# Patient Record
Sex: Male | Born: 1935 | Race: White | Hispanic: No | Marital: Married | Smoking: Never smoker
Health system: Southern US, Community
[De-identification: ages and names within clinical notes are randomized; demographics above are authoritative.]

## PROBLEM LIST (undated history)

## (undated) DIAGNOSIS — Z973 Presence of spectacles and contact lenses: Secondary | ICD-10-CM

## (undated) DIAGNOSIS — M1710 Unilateral primary osteoarthritis, unspecified knee: Secondary | ICD-10-CM

## (undated) DIAGNOSIS — R32 Unspecified urinary incontinence: Secondary | ICD-10-CM

## (undated) DIAGNOSIS — Z974 Presence of external hearing-aid: Secondary | ICD-10-CM

## (undated) DIAGNOSIS — E785 Hyperlipidemia, unspecified: Secondary | ICD-10-CM

## (undated) DIAGNOSIS — N400 Enlarged prostate without lower urinary tract symptoms: Secondary | ICD-10-CM

## (undated) DIAGNOSIS — K219 Gastro-esophageal reflux disease without esophagitis: Secondary | ICD-10-CM

## (undated) DIAGNOSIS — K5909 Other constipation: Secondary | ICD-10-CM

## (undated) DIAGNOSIS — R339 Retention of urine, unspecified: Secondary | ICD-10-CM

## (undated) DIAGNOSIS — M179 Osteoarthritis of knee, unspecified: Secondary | ICD-10-CM

## (undated) HISTORY — PX: OTHER SURGICAL HISTORY: SHX169

## (undated) HISTORY — PX: TONSILLECTOMY: SUR1361

## (undated) HISTORY — DX: Hyperlipidemia, unspecified: E78.5

## (undated) HISTORY — PX: KNEE SURGERY: SHX244

---

## 1997-12-21 ENCOUNTER — Ambulatory Visit (HOSPITAL_COMMUNITY): Admission: RE | Admit: 1997-12-21 | Discharge: 1997-12-21 | Payer: Self-pay | Admitting: Urology

## 2006-09-16 ENCOUNTER — Ambulatory Visit (HOSPITAL_COMMUNITY): Admission: RE | Admit: 2006-09-16 | Discharge: 2006-09-16 | Payer: Self-pay | Admitting: Family Medicine

## 2008-06-14 ENCOUNTER — Ambulatory Visit (HOSPITAL_COMMUNITY): Admission: RE | Admit: 2008-06-14 | Discharge: 2008-06-14 | Payer: Self-pay | Admitting: Ophthalmology

## 2008-06-28 ENCOUNTER — Ambulatory Visit (HOSPITAL_COMMUNITY): Admission: RE | Admit: 2008-06-28 | Discharge: 2008-06-28 | Payer: Self-pay | Admitting: Ophthalmology

## 2009-08-06 ENCOUNTER — Ambulatory Visit (HOSPITAL_COMMUNITY): Admission: RE | Admit: 2009-08-06 | Discharge: 2009-08-06 | Payer: Self-pay | Admitting: Family Medicine

## 2010-02-15 ENCOUNTER — Ambulatory Visit (HOSPITAL_COMMUNITY): Admission: RE | Admit: 2010-02-15 | Discharge: 2010-02-15 | Payer: Self-pay | Admitting: Family Medicine

## 2010-06-02 ENCOUNTER — Emergency Department (HOSPITAL_COMMUNITY): Admission: EM | Admit: 2010-06-02 | Discharge: 2010-06-03 | Payer: Self-pay | Admitting: Emergency Medicine

## 2010-10-07 LAB — URINALYSIS, ROUTINE W REFLEX MICROSCOPIC
Glucose, UA: NEGATIVE mg/dL
Ketones, ur: NEGATIVE mg/dL
Nitrite: POSITIVE — AB
Protein, ur: 30 mg/dL — AB
Specific Gravity, Urine: 1.023 (ref 1.005–1.030)

## 2010-10-07 LAB — URINE CULTURE: Colony Count: >=100000

## 2010-10-07 LAB — URINE MICROSCOPIC-ADD ON

## 2010-10-11 LAB — URINALYSIS, MICROSCOPIC ONLY
Glucose, UA: NEGATIVE mg/dL
Nitrite: NEGATIVE
Protein, ur: NEGATIVE mg/dL
Specific Gravity, Urine: 1.031 — ABNORMAL HIGH (ref 1.005–1.030)
Urobilinogen, UA: 1 mg/dL (ref 0.0–1.0)

## 2011-04-28 LAB — BASIC METABOLIC PANEL
Calcium: 8.4
Chloride: 106
Creatinine, Ser: 0.76
GFR calc Af Amer: >60
Potassium: 3.4 — ABNORMAL LOW

## 2011-04-28 LAB — CBC
HCT: 33.6 — ABNORMAL LOW
Hemoglobin: 11.6 — ABNORMAL LOW
RBC: 3.58 — ABNORMAL LOW
RDW: 13.3

## 2012-03-22 ENCOUNTER — Other Ambulatory Visit: Payer: Self-pay | Admitting: Family Medicine

## 2012-03-22 DIAGNOSIS — H539 Unspecified visual disturbance: Secondary | ICD-10-CM

## 2012-03-22 DIAGNOSIS — Z139 Encounter for screening, unspecified: Secondary | ICD-10-CM

## 2012-03-25 ENCOUNTER — Ambulatory Visit
Admission: RE | Admit: 2012-03-25 | Discharge: 2012-03-25 | Disposition: A | Discharge status: Home / Self Care | Payer: Medicare Other | Source: Ambulatory Visit | Attending: Family Medicine | Admitting: Family Medicine

## 2012-03-25 DIAGNOSIS — Z139 Encounter for screening, unspecified: Secondary | ICD-10-CM

## 2012-03-25 DIAGNOSIS — H539 Unspecified visual disturbance: Secondary | ICD-10-CM

## 2012-04-04 ENCOUNTER — Other Ambulatory Visit: Payer: Self-pay | Admitting: Family Medicine

## 2012-04-04 DIAGNOSIS — H532 Diplopia: Secondary | ICD-10-CM

## 2012-04-06 ENCOUNTER — Other Ambulatory Visit: Payer: Medicare Other

## 2012-04-11 ENCOUNTER — Ambulatory Visit
Admission: RE | Admit: 2012-04-11 | Discharge: 2012-04-11 | Disposition: A | Discharge status: Home / Self Care | Payer: Medicare Other | Source: Ambulatory Visit | Attending: Family Medicine | Admitting: Family Medicine

## 2012-04-11 DIAGNOSIS — H532 Diplopia: Secondary | ICD-10-CM

## 2012-10-31 ENCOUNTER — Ambulatory Visit (INDEPENDENT_AMBULATORY_CARE_PROVIDER_SITE_OTHER): Payer: Medicare Other

## 2012-10-31 DIAGNOSIS — E538 Deficiency of other specified B group vitamins: Secondary | ICD-10-CM

## 2012-10-31 MED ORDER — CYANOCOBALAMIN 1000 MCG/ML IJ SOLN
1000.0000 ug | INTRAMUSCULAR | Status: DC
  Administered 2012-10-31 – 2014-08-23 (×17): 1000 ug via INTRAMUSCULAR

## 2012-11-17 ENCOUNTER — Telehealth: Payer: Self-pay | Admitting: Family Medicine

## 2012-11-17 NOTE — Telephone Encounter (Signed)
Samples of Bystolic,  Zetia, given for his wife, Kathie Rhodes.

## 2012-12-01 ENCOUNTER — Ambulatory Visit: Payer: Medicare Other

## 2012-12-01 ENCOUNTER — Ambulatory Visit (INDEPENDENT_AMBULATORY_CARE_PROVIDER_SITE_OTHER): Payer: Medicare Other | Admitting: *Deleted

## 2012-12-01 DIAGNOSIS — E538 Deficiency of other specified B group vitamins: Secondary | ICD-10-CM

## 2012-12-01 NOTE — Progress Notes (Signed)
Patient tolerated well.

## 2012-12-01 NOTE — Patient Instructions (Signed)
Vitamin B12 Injections Every person needs vitamin B12. A deficiency develops when the body does not get enough of it. One way to overcome this is by getting B12 shots (injections). A B12 shot puts the vitamin directly into muscle tissue. This avoids any problems your body might have in absorbing it from food or a pill. In some people, the body has trouble using the vitamin correctly. This can cause a B12 deficiency. Not consuming enough of the vitamin can also cause a deficiency. Getting enough vitamin B12 can be hard for elderly people. Sometimes, they do not eat a well-balanced diet. The elderly are also more likely than younger people to have medical conditions or take medications that can lead to a deficiency. WHAT DOES VITAMIN B12 DO? Vitamin B12 does many things to help the body work right:  It helps the body make healthy red blood cells.  It helps maintain nerve cells.  It is involved in the body's process of converting food into energy (metabolism).  It is needed to make the genetic material in all cells (DNA). VITAMIN B12 FOOD SOURCES Most people get plenty of vitamin B12 through the foods they eat. It is present in:  Meat, fish, poultry, and eggs.  Milk and milk products.  It also is added when certain foods are made, including some breads, cereals and yogurts. The food is then called "fortified". CAUSES The most common causes of vitamin B12 deficiency are:  Pernicious anemia. The condition develops when the body cannot make enough healthy red blood cells. This stems from a lack of a protein made in the stomach (intrinsic factor). People without this protein cannot absorb enough vitamin B12 from food.  Malabsorption. This is when the body cannot absorb the vitamin. It can be caused by:  Pernicious anemia.  Surgery to remove part or all of the stomach can lead to malabsorption. Removal of part or all of the small intestine can also cause malabsorption.  Vegetarian diet.  People who are strict about not eating foods from animals could have trouble taking in enough vitamin B12 from diet alone.  Medications. Some medicines have been linked to B12 deficiency, such as Metformin (a drug prescribed for type 2 diabetes). Long-term use of stomach acid suppressants also can keep the vitamin from being absorbed.  Intestinal problems such as inflammatory bowel disease. If there are problems in the digestive tract, vitamin B12 may not be absorbed in good enough amounts. SYMPTOMS People who do not get enough B12 can develop problems. These can include:  Anemia. This is when the body has too few red blood cells. Red blood cells carry oxygen to the rest of the body. Without a healthy supply of red blood cells, people can feel:  Tired (fatigued).  Weak.  Severe anemia can cause:  Shortness of breath.  Dizziness.  Rapid heart rate.  Paleness.  Other Vitamin B12 deficiency symptoms include:  Diarrhea.  Numbness or tingling in the hands or feet.  Loss of appetite.  Confusion.  Sores on the tongue or in the mouth. LET YOUR CAREGIVER KNOW ABOUT:  Any allergies. It is very important to know if you are allergic or sensitive to cobalt. Vitamin B12 contains cobalt.  Any history of kidney disease.  All medications you are taking. Include prescription and over-the-counter medicines, herbs and creams.  Whether you are pregnant or breast-feeding.  If you have Leber's disease, a hereditary eye condition, vitamin B12 could make it worse. RISKS AND COMPLICATIONS Reactions to an injection are   usually temporary. They might include:  Pain at the injection site.  Redness, swelling or tenderness at the site.  Headache, dizziness or weakness.  Nausea, upset stomach or diarrhea.  Numbness or tingling.  Fever.  Joint pain.  Itching or rash. If a reaction does not go away in a short while, talk with your healthcare provider. A change in the way the shots are  given, or where they are given, might need to be made. BEFORE AN INJECTION To decide whether B12 injections are right for you, your healthcare provider will probably:  Ask about your medical history.  Ask questions about your diet.  Ask about symptoms such as:  Have you felt weak?  Do you feel unusually tired?  Do you get dizzy?  Order blood tests. These may include a test to:  Check the level of red cells in your blood.  Measure B12 levels.  Check for the presence of intrinsic factor. VITAMIN B12 INJECTIONS How often you will need a vitamin B12 injection will depend on how severe your deficiency is. This also will affect how long you will need to get them. People with pernicious anemia usually get injections for their entire life. Others might get them for a shorter period. For many people, injections are given daily or weekly for several weeks. Then, once B12 levels are normal, injections are given just once a month. If the cause of the deficiency can be fixed, the injections can be stopped. Talk with your healthcare provider about what you should expect. For an injection:  The injection site will be cleaned with an alcohol swab.  Your healthcare provider will insert a needle directly into a muscle. Most any muscle can be used. Most often, an arm muscle is used. A buttocks muscle can also be used. Many people say shots in that area are less painful.  A small adhesive bandage may be put over the injection site. It usually can be taken off in an hour or less. Injections can be given by your healthcare provider. In some cases, family members give them. Sometimes, people give them to themselves. Talk with your healthcare provider about what would be best for you. If someone other than your healthcare provider will be giving the shots, the person will need to be trained to give them correctly. HOME CARE INSTRUCTIONS   You can remove the adhesive bandage within an hour of getting a  shot.  You should be able to go about your normal activities right away.  Avoid drinking large amounts of alcohol while taking vitamin B12 shots. Alcohol can interfere with the body's use of the vitamin. SEEK MEDICAL CARE IF:   Pain, redness, swelling or tenderness at the injection site does not get better or gets worse.  Headache, dizziness or weakness does not go away.  You develop a fever of more than 100.5 F (38.1 C). SEEK IMMEDIATE MEDICAL CARE IF:   You have chest pain.  You develop shortness of breath.  You have muscle weakness that gets worse.  You develop numbness, weakness or tingling on one side or one area of the body.  You have symptoms of an allergic reaction, such as:  Hives.  Difficulty breathing.  Swelling of the lips, face, tongue or throat.  You develop a fever of more than 102.0 F (38.9 C). MAKE SURE YOU:   Understand these instructions.  Will watch your condition.  Will get help right away if you are not doing well or get worse. Document   Released: 10/09/2008 Document Revised: 10/05/2011 Document Reviewed: 10/09/2008 [employer] Patient Information 2013 [employer].  

## 2012-12-01 NOTE — Progress Notes (Deleted)
  Subjective:    Patient ID: Luke Carter, male    DOB: 1921-05-30, 79 y.o.   MRN: [id number]  HPI    Review of Systems     Objective:   Physical Exam        Assessment & Plan:

## 2012-12-20 ENCOUNTER — Encounter: Payer: Self-pay | Admitting: *Deleted

## 2012-12-26 ENCOUNTER — Telehealth: Payer: Self-pay | Admitting: Family Medicine

## 2012-12-26 NOTE — Telephone Encounter (Signed)
No samples available Pt notified 

## 2013-01-04 ENCOUNTER — Ambulatory Visit (INDEPENDENT_AMBULATORY_CARE_PROVIDER_SITE_OTHER): Payer: Medicare Other

## 2013-01-04 DIAGNOSIS — E538 Deficiency of other specified B group vitamins: Secondary | ICD-10-CM

## 2013-01-11 ENCOUNTER — Ambulatory Visit: Payer: Self-pay | Admitting: Family Medicine

## 2013-01-17 ENCOUNTER — Telehealth: Payer: Self-pay | Admitting: Family Medicine

## 2013-01-17 NOTE — Telephone Encounter (Signed)
PLEASE RE SCHEDULE

## 2013-01-24 ENCOUNTER — Ambulatory Visit: Payer: Self-pay | Admitting: Family Medicine

## 2013-02-01 ENCOUNTER — Telehealth: Payer: Self-pay | Admitting: Family Medicine

## 2013-02-01 NOTE — Telephone Encounter (Signed)
No samples patient aware. 

## 2013-02-06 ENCOUNTER — Ambulatory Visit: Payer: Medicare Other

## 2013-02-08 ENCOUNTER — Ambulatory Visit: Payer: Medicare Other | Admitting: Family Medicine

## 2013-02-10 ENCOUNTER — Ambulatory Visit (INDEPENDENT_AMBULATORY_CARE_PROVIDER_SITE_OTHER): Payer: Medicare Other

## 2013-02-10 DIAGNOSIS — E538 Deficiency of other specified B group vitamins: Secondary | ICD-10-CM

## 2013-02-14 ENCOUNTER — Other Ambulatory Visit: Payer: Self-pay | Admitting: Family Medicine

## 2013-02-15 ENCOUNTER — Other Ambulatory Visit: Payer: Medicare Other

## 2013-02-15 ENCOUNTER — Ambulatory Visit (INDEPENDENT_AMBULATORY_CARE_PROVIDER_SITE_OTHER): Payer: Medicare Other

## 2013-02-15 ENCOUNTER — Encounter: Payer: Self-pay | Admitting: Family Medicine

## 2013-02-15 ENCOUNTER — Ambulatory Visit (INDEPENDENT_AMBULATORY_CARE_PROVIDER_SITE_OTHER): Payer: Medicare Other | Admitting: Family Medicine

## 2013-02-15 ENCOUNTER — Other Ambulatory Visit: Payer: Self-pay | Admitting: Family Medicine

## 2013-02-15 VITALS — BP 122/61 | HR 64 | Temp 97.1°F | Ht 67.0 in | Wt 165.2 lb

## 2013-02-15 DIAGNOSIS — K219 Gastro-esophageal reflux disease without esophagitis: Secondary | ICD-10-CM | POA: Insufficient documentation

## 2013-02-15 DIAGNOSIS — E039 Hypothyroidism, unspecified: Secondary | ICD-10-CM | POA: Insufficient documentation

## 2013-02-15 DIAGNOSIS — E291 Testicular hypofunction: Secondary | ICD-10-CM

## 2013-02-15 DIAGNOSIS — Z79899 Other long term (current) drug therapy: Secondary | ICD-10-CM

## 2013-02-15 DIAGNOSIS — R5383 Other fatigue: Secondary | ICD-10-CM

## 2013-02-15 DIAGNOSIS — N400 Enlarged prostate without lower urinary tract symptoms: Secondary | ICD-10-CM

## 2013-02-15 DIAGNOSIS — R0602 Shortness of breath: Secondary | ICD-10-CM

## 2013-02-15 DIAGNOSIS — E538 Deficiency of other specified B group vitamins: Secondary | ICD-10-CM

## 2013-02-15 DIAGNOSIS — E785 Hyperlipidemia, unspecified: Secondary | ICD-10-CM

## 2013-02-15 DIAGNOSIS — I10 Essential (primary) hypertension: Secondary | ICD-10-CM

## 2013-02-15 LAB — POCT CBC
Granulocyte percent: 50.8 %G (ref 37–80)
HCT, POC: 35.2 % — AB (ref 43.5–53.7)
MCH, POC: 30.6 pg (ref 27–31.2)
POC Granulocyte: 2.1 (ref 2–6.9)
POC LYMPH PERCENT: 41 %L (ref 10–50)
Platelet Count, POC: 173 10*3/uL (ref 142–424)
WBC: 4.2 10*3/uL — AB (ref 4.6–10.2)

## 2013-02-15 NOTE — Patient Instructions (Addendum)
Fall precautions discussed Continue current meds and therapeutic lifestyle changes 

## 2013-02-15 NOTE — Progress Notes (Signed)
  Subjective:    Patient ID: Luke Carter, male    DOB: 31-Aug-1920, 79 y.o.   MRN: [id number]  HPI Patient comes in today for routine followup of chronic medical problems. He is followed by the urologist also. His biggest complaint is his fatigue with some shortness of breath and arthralgias. He has a history of hyperlipidemia, hypothyroidism, GERD, and B12 deficiency.   Review of Systems  Constitutional: Positive for fatigue (worsening).  HENT: Negative for congestion, sneezing and postnasal drip.   Eyes: Negative.   Respiratory: Positive for shortness of breath (with exertion). Negative for cough and chest tightness.   Cardiovascular: Negative for chest pain and leg swelling.  Gastrointestinal: Negative.   Endocrine: Negative.   Genitourinary: Positive for urgency and frequency. Negative for dysuria.  Musculoskeletal: Positive for arthralgias (R hip, bilateral knees). Negative for back pain.  Skin: Positive for rash (bilateral arms).  Allergic/Immunologic: Negative for environmental allergies.  Neurological: Positive for tremors (intermitent).  Psychiatric/Behavioral: Negative for sleep disturbance. The patient is not nervous/anxious.        Objective:   Physical Exam BP 122/61  Pulse 64  Temp(Src) 97.1 F (36.2 C) (Oral)  Ht 5\' 7"  (1.702 m)  Wt 165 lb 3.2 oz (74.934 kg)  BMI 25.87 kg/m2  The patient appeared well nourished and normally developed for his age, alert and oriented to time and place. Speech, behavior and judgement appear normal. Vital signs as documented.  Head exam is unremarkable. No scleral icterus or pallor noted. There was some nasal congestion bilaterally. Mouth throat and ears were normal. He wears bilateral hearing aids.  Neck is without jugular venous distension, thyromegally, or carotid bruits. Carotid upstrokes are brisk bilaterally. No cervical adenopathy. Lungs are clear anteriorly and posteriorly to auscultation. Normal respiratory effort. Cardiac  exam reveals regular rate and rhythm at 72 per minute. First and second heart sounds normal.  No murmurs, rubs or gallops.  Abdominal exam reveals normal bowl sounds, no masses, no organomegaly and no aortic enlargement. No inguinal adenopathy. There was no abdominal tenderness. Extremities are nonedematous and both femoral and pedal pulses are normal. There was some lower extremity varices. Skin without pallor or jaundice.  Warm and dry, without rash. Neurologic exam reveals normal deep tendon reflexes and normal sensation.    [facility] reading (PRIMARY) by  Dr. 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                                   Assessment & Plan:  1. Vitamin B12 deficiency - Vitamin B12  2. Hyperlipemia  3. GERD (gastroesophageal reflux disease)  4. Hypothyroid  5. BPH (benign prostatic hypertrophy) - Testosterone, Free, Direct  6. SOB (shortness of breath) - DG Chest 2 View  7. 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

## 2013-02-16 ENCOUNTER — Telehealth: Payer: Self-pay | Admitting: *Deleted

## 2013-02-16 LAB — TESTOSTERONE, FREE, DIRECT: Free Testosterone, Direct: 0.9 pg/mL — ABNORMAL LOW (ref 3.8–34.2)

## 2013-02-16 LAB — THYROID PANEL WITH TSH: T3 Uptake: 37.7 % — ABNORMAL HIGH (ref 22.5–37.0)

## 2013-02-16 LAB — HEPATIC FUNCTION PANEL
ALT: 14 U/L (ref 0–53)
Total Protein: 6.2 g/dL (ref 6.0–8.3)

## 2013-02-16 LAB — BASIC METABOLIC PANEL WITH GFR
CO2: 28 mEq/L (ref 19–32)
Calcium: 9.3 mg/dL (ref 8.4–10.5)
Chloride: 107 mEq/L (ref 96–112)
Creat: 0.86 mg/dL (ref 0.50–1.35)
Glucose, Bld: 99 mg/dL (ref 70–99)
Potassium: 4.7 mEq/L (ref 3.5–5.3)

## 2013-02-16 LAB — VITAMIN B12: Vitamin B-12: 606 pg/mL (ref 211–911)

## 2013-02-16 LAB — NMR LIPOPROFILE WITH LIPIDS
Cholesterol, Total: 132 mg/dL (ref <200)
LDL (calc): 72 mg/dL (ref <100)
LDL Particle Number: 1330 nmol/L — ABNORMAL HIGH (ref <1000)
LP-IR Score: 49 — ABNORMAL HIGH (ref <=45)
Large VLDL-P: <0.8 nmol/L (ref <=2.7)
VLDL Size: 47.7 nm — ABNORMAL HIGH (ref <=46.6)

## 2013-02-16 NOTE — Telephone Encounter (Signed)
Pt notified of results

## 2013-02-16 NOTE — Telephone Encounter (Signed)
Message copied by Bearl Mulberry on Thu Feb 16, 2013  7:08 PM ------      Message from: Ernestina Penna      Created: Wed Feb 15, 2013  1:26 PM       The CBC has a slightly decreased but stable hemoglobin and a white blood cell count is not elevated. The platelet count is adequate . ------

## 2013-02-20 ENCOUNTER — Telehealth: Payer: Self-pay | Admitting: *Deleted

## 2013-02-20 DIAGNOSIS — M25551 Pain in right hip: Secondary | ICD-10-CM

## 2013-02-20 NOTE — Telephone Encounter (Signed)
Pt needs to see Dr Berton Lan in [place] for hip pain has appt in Sept but needs to be seen sooner

## 2013-02-23 ENCOUNTER — Other Ambulatory Visit: Payer: Self-pay | Admitting: *Deleted

## 2013-02-23 DIAGNOSIS — G8929 Other chronic pain: Secondary | ICD-10-CM

## 2013-02-27 ENCOUNTER — Ambulatory Visit (HOSPITAL_COMMUNITY)
Admission: RE | Admit: 2013-02-27 | Discharge: 2013-02-27 | Disposition: A | Discharge status: Home / Self Care | Payer: Medicare Other | Source: Ambulatory Visit | Attending: Family Medicine | Admitting: Family Medicine

## 2013-02-27 DIAGNOSIS — M76899 Other specified enthesopathies of unspecified lower limb, excluding foot: Secondary | ICD-10-CM | POA: Insufficient documentation

## 2013-02-27 DIAGNOSIS — M533 Sacrococcygeal disorders, not elsewhere classified: Secondary | ICD-10-CM | POA: Insufficient documentation

## 2013-02-27 DIAGNOSIS — M25559 Pain in unspecified hip: Secondary | ICD-10-CM | POA: Insufficient documentation

## 2013-02-27 DIAGNOSIS — G8929 Other chronic pain: Secondary | ICD-10-CM

## 2013-02-27 DIAGNOSIS — M25551 Pain in right hip: Secondary | ICD-10-CM

## 2013-02-28 NOTE — Addendum Note (Signed)
Addended by: Orma Render F on: 02/28/2013 04:12 PM   Modules accepted: Orders

## 2013-02-28 NOTE — Addendum Note (Signed)
Addended by: Orma Render F on: 02/28/2013 04:06 PM   Modules accepted: Orders

## 2013-03-15 ENCOUNTER — Ambulatory Visit (INDEPENDENT_AMBULATORY_CARE_PROVIDER_SITE_OTHER): Payer: Medicare Other

## 2013-03-15 DIAGNOSIS — E538 Deficiency of other specified B group vitamins: Secondary | ICD-10-CM

## 2013-03-15 NOTE — Progress Notes (Signed)
Tolerated B12 injection without difficulty.

## 2013-03-15 NOTE — Patient Instructions (Addendum)
Vitamin B12 Injections Every person needs vitamin B12. A deficiency develops when the body does not get enough of it. One way to overcome this is by getting B12 shots (injections). A B12 shot puts the vitamin directly into muscle tissue. This avoids any problems your body might have in absorbing it from food or a pill. In some people, the body has trouble using the vitamin correctly. This can cause a B12 deficiency. Not consuming enough of the vitamin can also cause a deficiency. Getting enough vitamin B12 can be hard for elderly people. Sometimes, they do not eat a well-balanced diet. The elderly are also more likely than younger people to have medical conditions or take medications that can lead to a deficiency. WHAT DOES VITAMIN B12 DO? Vitamin B12 does many things to help the body work right:  It helps the body make healthy red blood cells.  It helps maintain nerve cells.  It is involved in the body's process of converting food into energy (metabolism).  It is needed to make the genetic material in all cells (DNA). VITAMIN B12 FOOD SOURCES Most people get plenty of vitamin B12 through the foods they eat. It is present in:  Meat, fish, poultry, and eggs.  Milk and milk products.  It also is added when certain foods are made, including some breads, cereals and yogurts. The food is then called "fortified". CAUSES The most common causes of vitamin B12 deficiency are:  Pernicious anemia. The condition develops when the body cannot make enough healthy red blood cells. This stems from a lack of a protein made in the stomach (intrinsic factor). People without this protein cannot absorb enough vitamin B12 from food.  Malabsorption. This is when the body cannot absorb the vitamin. It can be caused by:  Pernicious anemia.  Surgery to remove part or all of the stomach can lead to malabsorption. Removal of part or all of the small intestine can also cause malabsorption.  Vegetarian diet.  People who are strict about not eating foods from animals could have trouble taking in enough vitamin B12 from diet alone.  Medications. Some medicines have been linked to B12 deficiency, such as Metformin (a drug prescribed for type 2 diabetes). Long-term use of stomach acid suppressants also can keep the vitamin from being absorbed.  Intestinal problems such as inflammatory bowel disease. If there are problems in the digestive tract, vitamin B12 may not be absorbed in good enough amounts. SYMPTOMS People who do not get enough B12 can develop problems. These can include:  Anemia. This is when the body has too few red blood cells. Red blood cells carry oxygen to the rest of the body. Without a healthy supply of red blood cells, people can feel:  Tired (fatigued).  Weak.  Severe anemia can cause:  Shortness of breath.  Dizziness.  Rapid heart rate.  Paleness.  Other Vitamin B12 deficiency symptoms include:  Diarrhea.  Numbness or tingling in the hands or feet.  Loss of appetite.  Confusion.  Sores on the tongue or in the mouth. LET YOUR CAREGIVER KNOW ABOUT:  Any allergies. It is very important to know if you are allergic or sensitive to cobalt. Vitamin B12 contains cobalt.  Any history of kidney disease.  All medications you are taking. Include prescription and over-the-counter medicines, herbs and creams.  Whether you are pregnant or breast-feeding.  If you have Leber's disease, a hereditary eye condition, vitamin B12 could make it worse. RISKS AND COMPLICATIONS Reactions to an injection are   usually temporary. They might include:  Pain at the injection site.  Redness, swelling or tenderness at the site.  Headache, dizziness or weakness.  Nausea, upset stomach or diarrhea.  Numbness or tingling.  Fever.  Joint pain.  Itching or rash. If a reaction does not go away in a short while, talk with your healthcare provider. A change in the way the shots are  given, or where they are given, might need to be made. BEFORE AN INJECTION To decide whether B12 injections are right for you, your healthcare provider will probably:  Ask about your medical history.  Ask questions about your diet.  Ask about symptoms such as:  Have you felt weak?  Do you feel unusually tired?  Do you get dizzy?  Order blood tests. These may include a test to:  Check the level of red cells in your blood.  Measure B12 levels.  Check for the presence of intrinsic factor. VITAMIN B12 INJECTIONS How often you will need a vitamin B12 injection will depend on how severe your deficiency is. This also will affect how long you will need to get them. People with pernicious anemia usually get injections for their entire life. Others might get them for a shorter period. For many people, injections are given daily or weekly for several weeks. Then, once B12 levels are normal, injections are given just once a month. If the cause of the deficiency can be fixed, the injections can be stopped. Talk with your healthcare provider about what you should expect. For an injection:  The injection site will be cleaned with an alcohol swab.  Your healthcare provider will insert a needle directly into a muscle. Most any muscle can be used. Most often, an arm muscle is used. A buttocks muscle can also be used. Many people say shots in that area are less painful.  A small adhesive bandage may be put over the injection site. It usually can be taken off in an hour or less. Injections can be given by your healthcare provider. In some cases, family members give them. Sometimes, people give them to themselves. Talk with your healthcare provider about what would be best for you. If someone other than your healthcare provider will be giving the shots, the person will need to be trained to give them correctly. HOME CARE INSTRUCTIONS   You can remove the adhesive bandage within an hour of getting a  shot.  You should be able to go about your normal activities right away.  Avoid drinking large amounts of alcohol while taking vitamin B12 shots. Alcohol can interfere with the body's use of the vitamin. SEEK MEDICAL CARE IF:   Pain, redness, swelling or tenderness at the injection site does not get better or gets worse.  Headache, dizziness or weakness does not go away.  You develop a fever of more than 100.5 F (38.1 C). SEEK IMMEDIATE MEDICAL CARE IF:   You have chest pain.  You develop shortness of breath.  You have muscle weakness that gets worse.  You develop numbness, weakness or tingling on one side or one area of the body.  You have symptoms of an allergic reaction, such as:  Hives.  Difficulty breathing.  Swelling of the lips, face, tongue or throat.  You develop a fever of more than 102.0 F (38.9 C). MAKE SURE YOU:   Understand these instructions.  Will watch your condition.  Will get help right away if you are not doing well or get worse. Document   Released: 10/09/2008 Document Revised: 10/05/2011 Document Reviewed: 10/09/2008 [employer] Patient Information 2014 [employer].  

## 2013-05-03 ENCOUNTER — Ambulatory Visit (INDEPENDENT_AMBULATORY_CARE_PROVIDER_SITE_OTHER): Payer: Medicare Other | Admitting: *Deleted

## 2013-05-03 ENCOUNTER — Encounter (INDEPENDENT_AMBULATORY_CARE_PROVIDER_SITE_OTHER): Payer: Self-pay

## 2013-05-03 DIAGNOSIS — Z23 Encounter for immunization: Secondary | ICD-10-CM

## 2013-05-03 DIAGNOSIS — E538 Deficiency of other specified B group vitamins: Secondary | ICD-10-CM

## 2013-06-09 ENCOUNTER — Ambulatory Visit (INDEPENDENT_AMBULATORY_CARE_PROVIDER_SITE_OTHER): Payer: Medicare Other | Admitting: *Deleted

## 2013-06-09 DIAGNOSIS — E538 Deficiency of other specified B group vitamins: Secondary | ICD-10-CM

## 2013-06-09 NOTE — Patient Instructions (Signed)
Vitamin B12 Injections Every person needs vitamin B12. A deficiency develops when the body does not get enough of it. One way to overcome this is by getting B12 shots (injections). A B12 shot puts the vitamin directly into muscle tissue. This avoids any problems your body might have in absorbing it from food or a pill. In some people, the body has trouble using the vitamin correctly. This can cause a B12 deficiency. Not consuming enough of the vitamin can also cause a deficiency. Getting enough vitamin B12 can be hard for elderly people. Sometimes, they do not eat a well-balanced diet. The elderly are also more likely than younger people to have medical conditions or take medications that can lead to a deficiency. WHAT DOES VITAMIN B12 DO? Vitamin B12 does many things to help the body work right:  It helps the body make healthy red blood cells.  It helps maintain nerve cells.  It is involved in the body's process of converting food into energy (metabolism).  It is needed to make the genetic material in all cells (DNA). VITAMIN B12 FOOD SOURCES Most people get plenty of vitamin B12 through the foods they eat. It is present in:  Meat, fish, poultry, and eggs.  Milk and milk products.  It also is added when certain foods are made, including some breads, cereals and yogurts. The food is then called "fortified". CAUSES The most common causes of vitamin B12 deficiency are:  Pernicious anemia. The condition develops when the body cannot make enough healthy red blood cells. This stems from a lack of a protein made in the stomach (intrinsic factor). People without this protein cannot absorb enough vitamin B12 from food.  Malabsorption. This is when the body cannot absorb the vitamin. It can be caused by:  Pernicious anemia.  Surgery to remove part or all of the stomach can lead to malabsorption. Removal of part or all of the small intestine can also cause malabsorption.  Vegetarian diet.  People who are strict about not eating foods from animals could have trouble taking in enough vitamin B12 from diet alone.  Medications. Some medicines have been linked to B12 deficiency, such as Metformin (a drug prescribed for type 2 diabetes). Long-term use of stomach acid suppressants also can keep the vitamin from being absorbed.  Intestinal problems such as inflammatory bowel disease. If there are problems in the digestive tract, vitamin B12 may not be absorbed in good enough amounts. SYMPTOMS People who do not get enough B12 can develop problems. These can include:  Anemia. This is when the body has too few red blood cells. Red blood cells carry oxygen to the rest of the body. Without a healthy supply of red blood cells, people can feel:  Tired (fatigued).  Weak.  Severe anemia can cause:  Shortness of breath.  Dizziness.  Rapid heart rate.  Paleness.  Other Vitamin B12 deficiency symptoms include:  Diarrhea.  Numbness or tingling in the hands or feet.  Loss of appetite.  Confusion.  Sores on the tongue or in the mouth. LET YOUR CAREGIVER KNOW ABOUT:  Any allergies. It is very important to know if you are allergic or sensitive to cobalt. Vitamin B12 contains cobalt.  Any history of kidney disease.  All medications you are taking. Include prescription and over-the-counter medicines, herbs and creams.  Whether you are pregnant or breast-feeding.  If you have Leber's disease, a hereditary eye condition, vitamin B12 could make it worse. RISKS AND COMPLICATIONS Reactions to an injection are   usually temporary. They might include:  Pain at the injection site.  Redness, swelling or tenderness at the site.  Headache, dizziness or weakness.  Nausea, upset stomach or diarrhea.  Numbness or tingling.  Fever.  Joint pain.  Itching or rash. If a reaction does not go away in a short while, talk with your healthcare provider. A change in the way the shots are  given, or where they are given, might need to be made. BEFORE AN INJECTION To decide whether B12 injections are right for you, your healthcare provider will probably:  Ask about your medical history.  Ask questions about your diet.  Ask about symptoms such as:  Have you felt weak?  Do you feel unusually tired?  Do you get dizzy?  Order blood tests. These may include a test to:  Check the level of red cells in your blood.  Measure B12 levels.  Check for the presence of intrinsic factor. VITAMIN B12 INJECTIONS How often you will need a vitamin B12 injection will depend on how severe your deficiency is. This also will affect how long you will need to get them. People with pernicious anemia usually get injections for their entire life. Others might get them for a shorter period. For many people, injections are given daily or weekly for several weeks. Then, once B12 levels are normal, injections are given just once a month. If the cause of the deficiency can be fixed, the injections can be stopped. Talk with your healthcare provider about what you should expect. For an injection:  The injection site will be cleaned with an alcohol swab.  Your healthcare provider will insert a needle directly into a muscle. Most any muscle can be used. Most often, an arm muscle is used. A buttocks muscle can also be used. Many people say shots in that area are less painful.  A small adhesive bandage may be put over the injection site. It usually can be taken off in an hour or less. Injections can be given by your healthcare provider. In some cases, family members give them. Sometimes, people give them to themselves. Talk with your healthcare provider about what would be best for you. If someone other than your healthcare provider will be giving the shots, the person will need to be trained to give them correctly. HOME CARE INSTRUCTIONS   You can remove the adhesive bandage within an hour of getting a  shot.  You should be able to go about your normal activities right away.  Avoid drinking large amounts of alcohol while taking vitamin B12 shots. Alcohol can interfere with the body's use of the vitamin. SEEK MEDICAL CARE IF:   Pain, redness, swelling or tenderness at the injection site does not get better or gets worse.  Headache, dizziness or weakness does not go away.  You develop a fever of more than 100.5 F (38.1 C). SEEK IMMEDIATE MEDICAL CARE IF:   You have chest pain.  You develop shortness of breath.  You have muscle weakness that gets worse.  You develop numbness, weakness or tingling on one side or one area of the body.  You have symptoms of an allergic reaction, such as:  Hives.  Difficulty breathing.  Swelling of the lips, face, tongue or throat.  You develop a fever of more than 102.0 F (38.9 C). MAKE SURE YOU:   Understand these instructions.  Will watch your condition.  Will get help right away if you are not doing well or get worse. Document   Released: 10/09/2008 Document Revised: 10/05/2011 Document Reviewed: 10/09/2008 [employer] Patient Information 2014 [employer].  

## 2013-06-09 NOTE — Progress Notes (Signed)
Vitamin b12 injection given and tolerated well.  

## 2013-07-10 ENCOUNTER — Ambulatory Visit (INDEPENDENT_AMBULATORY_CARE_PROVIDER_SITE_OTHER): Payer: Medicare Other | Admitting: *Deleted

## 2013-07-10 DIAGNOSIS — E538 Deficiency of other specified B group vitamins: Secondary | ICD-10-CM

## 2013-07-10 NOTE — Progress Notes (Signed)
Patient ID: Luke Carter, male   DOB: [birth year], 79 y.o.   MRN: [id number] Pt tolerated inj well

## 2013-07-13 ENCOUNTER — Ambulatory Visit (INDEPENDENT_AMBULATORY_CARE_PROVIDER_SITE_OTHER): Payer: Medicare Other | Admitting: Family Medicine

## 2013-07-13 ENCOUNTER — Encounter: Payer: Self-pay | Admitting: Family Medicine

## 2013-07-13 VITALS — BP 136/76 | HR 71 | Temp 96.9°F | Ht 67.0 in | Wt 166.0 lb

## 2013-07-13 DIAGNOSIS — N476 Balanoposthitis: Secondary | ICD-10-CM

## 2013-07-13 DIAGNOSIS — R350 Frequency of micturition: Secondary | ICD-10-CM

## 2013-07-13 DIAGNOSIS — R32 Unspecified urinary incontinence: Secondary | ICD-10-CM

## 2013-07-13 DIAGNOSIS — IMO0001 Reserved for inherently not codable concepts without codable children: Secondary | ICD-10-CM

## 2013-07-13 DIAGNOSIS — N481 Balanitis: Secondary | ICD-10-CM

## 2013-07-13 LAB — POCT CBC
Granulocyte percent: 58.7 %G (ref 37–80)
HCT, POC: 37.6 % — AB (ref 43.5–53.7)
Hemoglobin: 12.1 g/dL — AB (ref 14.1–18.1)
Lymph, poc: 1.9 (ref 0.6–3.4)
MCH, POC: 29.8 pg (ref 27–31.2)
MCV: 92 fL (ref 80–97)
Platelet Count, POC: 155 10*3/uL (ref 142–424)
RDW, POC: 13.1 %
WBC: 5.6 10*3/uL (ref 4.6–10.2)

## 2013-07-13 LAB — POCT URINALYSIS DIPSTICK
Bilirubin, UA: NEGATIVE
Glucose, UA: NEGATIVE
Ketones, UA: NEGATIVE
Leukocytes, UA: NEGATIVE
pH, UA: 6

## 2013-07-13 LAB — POCT UA - MICROSCOPIC ONLY: Yeast, UA: NEGATIVE

## 2013-07-13 MED ORDER — CEPHALEXIN 500 MG PO CAPS: 500.0000 mg | ORAL_CAPSULE | Freq: Three times a day (TID) | ORAL | Status: DC

## 2013-07-13 NOTE — Progress Notes (Signed)
Subjective:    Patient ID: Luke Carter, male    DOB: January 21, 1921, 79 y.o.   MRN: [id number]  HPI Patient here today for urinary frequency.    Patient Active Problem List   Diagnosis Date Noted  . Vitamin B12 deficiency 02/15/2013  . Hyperlipemia 02/15/2013  . GERD (gastroesophageal reflux disease) 02/15/2013  . Hypothyroid 02/15/2013  . BPH (benign prostatic hypertrophy) 02/15/2013   Outpatient Encounter Prescriptions as of 07/13/2013  Medication Sig  . aspirin EC 81 MG tablet Take 81 mg by mouth daily.  . Calcium Carbonate-Vit D-Min 1200-1000 MG-UNIT CHEW Chew 1 tablet by mouth daily.  . finasteride (PROSCAR) 5 MG tablet Take 5 mg by mouth daily.  . fish oil-omega-3 fatty acids 1000 MG capsule Take 2 g by mouth daily.  Marland Kitchen levothyroxine (SYNTHROID, LEVOTHROID) 50 MCG tablet TAKE 1.5 TABLETS MON, WED, FRI, AND 1 TABLET SUN, TUES, THUR, AND SAT  . Multiple Vitamin (MULTIVITAMIN) tablet Take 1 tablet by mouth daily.  . niacin (NIASPAN) 1000 MG CR tablet Take 1,000 mg by mouth at bedtime.  Marland Kitchen omeprazole (PRILOSEC) 20 MG capsule Take 20 mg by mouth 2 (two) times daily.  . pravastatin (PRAVACHOL) 40 MG tablet Take 40 mg by mouth daily.  Marland Kitchen terazosin (HYTRIN) 5 MG capsule Take 5 mg by mouth at bedtime.  . TOVIAZ 8 MG TB24     Review of Systems  Constitutional: Negative.   HENT: Negative.   Eyes: Negative.   Respiratory: Negative.   Cardiovascular: Negative.   Gastrointestinal: Negative.   Endocrine: Negative.   Genitourinary: Positive for frequency.  Musculoskeletal: Negative.   Skin: Negative.   Allergic/Immunologic: Negative.   Neurological: Negative.   Hematological: Negative.   Psychiatric/Behavioral: Negative.    patient has swelling and edema of the foreskin and glans penis     Objective:   Physical Exam  Nursing note and vitals reviewed. Constitutional: He is oriented to person, place, and time. He appears well-developed and well-nourished. No distress.  HENT:    Head: Normocephalic and atraumatic.  Eyes: Conjunctivae and EOM are normal. Pupils are equal, round, and reactive to light. Right eye exhibits no discharge. Left eye exhibits no discharge. No scleral icterus.  Neck: Normal range of motion.  Abdominal: Soft. Bowel sounds are normal. He exhibits no distension and no mass. There is no tenderness. There is no rebound and no guarding.  Patient does have a large right inguinal hernia which he indicates has not changed any of her many years.  Genitourinary:  Patient has swelling and edema of the glans penis and the foreskin. The urethral opening appears normal  Musculoskeletal: Normal range of motion.  Neurological: He is alert and oriented to person, place, and time. He has normal reflexes.  Skin: Skin is warm and dry. No rash noted. There is erythema. No pallor.  Genital redness and swelling as indicated above  Psychiatric: He has a normal mood and affect. His behavior is normal. Judgment and thought content normal.    Results for orders placed in visit on 07/13/13  POCT UA - MICROSCOPIC ONLY      Result Value Range   WBC, Ur, HPF, POC occ     RBC, urine, microscopic 1-3     Bacteria, U Microscopic occ     Mucus, UA negative     Epithelial cells, urine per micros occ     Crystals, Ur, HPF, POC negative     Casts, Ur, LPF, POC negative  Yeast, UA negative    POCT URINALYSIS DIPSTICK      Result Value Range   Color, UA gold     Clarity, UA clear     Glucose, UA negative     Bilirubin, UA negative     Ketones, UA negative     Spec Grav, UA 1.020     Blood, UA trace     pH, UA 6.0     Protein, UA trace     Urobilinogen, UA negative     Nitrite, UA negative     Leukocytes, UA Negative            Assessment & Plan:  1. Frequency - POCT UA - Microscopic Only - POCT urinalysis dipstick - Urine culture - POCT Wet Prep ([place]et [place]) - POCT CBC - BMP8+EGFR  2. Urinary incontinence - BMP8+EGFR  3. Balanitis Cephalexin 500  mg 3 times a day for 10 day Monistat vaginal cream apply 3-4 times daily to glans penis and foreskin  Patient Instructions  Purchase Monistat cream and apply to affected area. Take medication as prescribed and call the office if symptoms worsen or persist.   Nyra Capes MD

## 2013-07-13 NOTE — Patient Instructions (Signed)
Purchase Monistat cream and apply to affected area. Take medication as prescribed and call the office if symptoms worsen or persist.

## 2013-07-14 LAB — BMP8+EGFR
BUN/Creatinine Ratio: 24 — ABNORMAL HIGH (ref 10–22)
CO2: 23 mmol/L (ref 18–29)
Chloride: 105 mmol/L (ref 97–108)
GFR calc non Af Amer: 77 mL/min/{1.73_m2} (ref >59)
Sodium: 144 mmol/L (ref 134–144)

## 2013-07-14 LAB — URINE CULTURE

## 2013-07-25 ENCOUNTER — Ambulatory Visit (INDEPENDENT_AMBULATORY_CARE_PROVIDER_SITE_OTHER): Payer: Medicare Other | Admitting: *Deleted

## 2013-07-25 ENCOUNTER — Telehealth: Payer: Self-pay | Admitting: Family Medicine

## 2013-07-25 DIAGNOSIS — Z23 Encounter for immunization: Secondary | ICD-10-CM

## 2013-07-25 NOTE — Telephone Encounter (Signed)
Message copied by Azalee Course on Tue Jul 25, 2013  3:25 PM ------      Message from: Ernestina Penna      Created: Sat Jul 15, 2013  9:39 PM       The urine culture had insignificant growth and therefore no treatment is needed for this. He should however continue the antibiotic that was started until it was completed ------

## 2013-07-25 NOTE — Telephone Encounter (Signed)
PT AWARE OF LAB RESULTS 

## 2013-08-21 ENCOUNTER — Ambulatory Visit (INDEPENDENT_AMBULATORY_CARE_PROVIDER_SITE_OTHER): Payer: Medicare Other | Admitting: *Deleted

## 2013-08-21 DIAGNOSIS — E538 Deficiency of other specified B group vitamins: Secondary | ICD-10-CM

## 2013-11-03 ENCOUNTER — Ambulatory Visit (INDEPENDENT_AMBULATORY_CARE_PROVIDER_SITE_OTHER): Payer: Medicare Other | Admitting: *Deleted

## 2013-11-03 DIAGNOSIS — E538 Deficiency of other specified B group vitamins: Secondary | ICD-10-CM

## 2013-11-03 NOTE — Progress Notes (Signed)
Patient tolerated well.

## 2013-12-07 ENCOUNTER — Ambulatory Visit (INDEPENDENT_AMBULATORY_CARE_PROVIDER_SITE_OTHER): Payer: Medicare Other | Admitting: *Deleted

## 2013-12-07 DIAGNOSIS — E538 Deficiency of other specified B group vitamins: Secondary | ICD-10-CM

## 2013-12-07 NOTE — Progress Notes (Signed)
Patient tolerated well.

## 2014-01-08 ENCOUNTER — Ambulatory Visit (INDEPENDENT_AMBULATORY_CARE_PROVIDER_SITE_OTHER): Payer: Medicare Other | Admitting: Family Medicine

## 2014-01-08 DIAGNOSIS — E538 Deficiency of other specified B group vitamins: Secondary | ICD-10-CM

## 2014-02-12 ENCOUNTER — Ambulatory Visit (INDEPENDENT_AMBULATORY_CARE_PROVIDER_SITE_OTHER): Payer: Medicare Other | Admitting: *Deleted

## 2014-02-12 DIAGNOSIS — E538 Deficiency of other specified B group vitamins: Secondary | ICD-10-CM

## 2014-02-12 NOTE — Progress Notes (Signed)
Vitamin b12 given and tolerated well. 

## 2014-02-12 NOTE — Patient Instructions (Signed)

## 2014-03-19 ENCOUNTER — Ambulatory Visit (INDEPENDENT_AMBULATORY_CARE_PROVIDER_SITE_OTHER): Payer: Medicare Other | Admitting: *Deleted

## 2014-03-19 DIAGNOSIS — E538 Deficiency of other specified B group vitamins: Secondary | ICD-10-CM

## 2014-03-19 NOTE — Progress Notes (Signed)
Vitamin b12 given and tolerated well. 

## 2014-03-19 NOTE — Patient Instructions (Signed)
Vitamin B12 Injections Every person needs vitamin B12. A deficiency develops when the body does not get enough of it. One way to overcome this is by getting B12 shots (injections). A B12 shot puts the vitamin directly into muscle tissue. This avoids any problems your body might have in absorbing it from food or a pill. In some people, the body has trouble using the vitamin correctly. This can cause a B12 deficiency. Not consuming enough of the vitamin can also cause a deficiency. Getting enough vitamin B12 can be hard for elderly people. Sometimes, they do not eat a well-balanced diet. The elderly are also more likely than younger people to have medical conditions or take medications that can lead to a deficiency. WHAT DOES VITAMIN B12 DO? Vitamin B12 does many things to help the body work right:  It helps the body make healthy red blood cells.  It helps maintain nerve cells.  It is involved in the body's process of converting food into energy (metabolism).  It is needed to make the genetic material in all cells (DNA). VITAMIN B12 FOOD SOURCES Most people get plenty of vitamin B12 through the foods they eat. It is present in:  Meat, fish, poultry, and eggs.  Milk and milk products.  It also is added when certain foods are made, including some breads, cereals and yogurts. The food is then called "fortified". CAUSES The most common causes of vitamin B12 deficiency are:  Pernicious anemia. The condition develops when the body cannot make enough healthy red blood cells. This stems from a lack of a protein made in the stomach (intrinsic factor). People without this protein cannot absorb enough vitamin B12 from food.  Malabsorption. This is when the body cannot absorb the vitamin. It can be caused by:  Pernicious anemia.  Surgery to remove part or all of the stomach can lead to malabsorption. Removal of part or all of the small intestine can also cause malabsorption.  Vegetarian diet.  People who are strict about not eating foods from animals could have trouble taking in enough vitamin B12 from diet alone.  Medications. Some medicines have been linked to B12 deficiency, such as Metformin (a drug prescribed for type 2 diabetes). Long-term use of stomach acid suppressants also can keep the vitamin from being absorbed.  Intestinal problems such as inflammatory bowel disease. If there are problems in the digestive tract, vitamin B12 may not be absorbed in good enough amounts. SYMPTOMS People who do not get enough B12 can develop problems. These can include:  Anemia. This is when the body has too few red blood cells. Red blood cells carry oxygen to the rest of the body. Without a healthy supply of red blood cells, people can feel:  Tired (fatigued).  Weak.  Severe anemia can cause:  Shortness of breath.  Dizziness.  Rapid heart rate.  Paleness.  Other Vitamin B12 deficiency symptoms include:  Diarrhea.  Numbness or tingling in the hands or feet.  Loss of appetite.  Confusion.  Sores on the tongue or in the mouth. LET YOUR CAREGIVER KNOW ABOUT:  Any allergies. It is very important to know if you are allergic or sensitive to cobalt. Vitamin B12 contains cobalt.  Any history of kidney disease.  All medications you are taking. Include prescription and over-the-counter medicines, herbs and creams.  Whether you are pregnant or breast-feeding.  If you have Leber's disease, a hereditary eye condition, vitamin B12 could make it worse. RISKS AND COMPLICATIONS Reactions to an injection are   usually temporary. They might include:  Pain at the injection site.  Redness, swelling or tenderness at the site.  Headache, dizziness or weakness.  Nausea, upset stomach or diarrhea.  Numbness or tingling.  Fever.  Joint pain.  Itching or rash. If a reaction does not go away in a short while, talk with your healthcare provider. A change in the way the shots are  given, or where they are given, might need to be made. BEFORE AN INJECTION To decide whether B12 injections are right for you, your healthcare provider will probably:  Ask about your medical history.  Ask questions about your diet.  Ask about symptoms such as:  Have you felt weak?  Do you feel unusually tired?  Do you get dizzy?  Order blood tests. These may include a test to:  Check the level of red cells in your blood.  Measure B12 levels.  Check for the presence of intrinsic factor. VITAMIN B12 INJECTIONS How often you will need a vitamin B12 injection will depend on how severe your deficiency is. This also will affect how long you will need to get them. People with pernicious anemia usually get injections for their entire life. Others might get them for a shorter period. For many people, injections are given daily or weekly for several weeks. Then, once B12 levels are normal, injections are given just once a month. If the cause of the deficiency can be fixed, the injections can be stopped. Talk with your healthcare provider about what you should expect. For an injection:  The injection site will be cleaned with an alcohol swab.  Your healthcare provider will insert a needle directly into a muscle. Most any muscle can be used. Most often, an arm muscle is used. A buttocks muscle can also be used. Many people say shots in that area are less painful.  A small adhesive bandage may be put over the injection site. It usually can be taken off in an hour or less. Injections can be given by your healthcare provider. In some cases, family members give them. Sometimes, people give them to themselves. Talk with your healthcare provider about what would be best for you. If someone other than your healthcare provider will be giving the shots, the person will need to be trained to give them correctly. HOME CARE INSTRUCTIONS   You can remove the adhesive bandage within an hour of getting a  shot.  You should be able to go about your normal activities right away.  Avoid drinking large amounts of alcohol while taking vitamin B12 shots. Alcohol can interfere with the body's use of the vitamin. SEEK MEDICAL CARE IF:   Pain, redness, swelling or tenderness at the injection site does not get better or gets worse.  Headache, dizziness or weakness does not go away.  You develop a fever of more than 100.5 F (38.1 C). SEEK IMMEDIATE MEDICAL CARE IF:   You have chest pain.  You develop shortness of breath.  You have muscle weakness that gets worse.  You develop numbness, weakness or tingling on one side or one area of the body.  You have symptoms of an allergic reaction, such as:  Hives.  Difficulty breathing.  Swelling of the lips, face, tongue or throat.  You develop a fever of more than 102.0 F (38.9 C). MAKE SURE YOU:   Understand these instructions.  Will watch your condition.  Will get help right away if you are not doing well or get worse. Document   Released: 10/09/2008 Document Revised: 10/05/2011 Document Reviewed: 10/09/2008 [employer] Patient Information 2015 [employer]. This information is not intended to replace advice given to you by your health care provider. Make sure you discuss any questions you have with your health care provider.  

## 2014-03-26 ENCOUNTER — Other Ambulatory Visit: Payer: Self-pay | Admitting: *Deleted

## 2014-03-26 MED ORDER — LEVOTHYROXINE SODIUM 50 MCG PO TABS: ORAL_TABLET | ORAL | Status: DC

## 2014-03-26 NOTE — Telephone Encounter (Signed)
This is okay to refill for one year, but please have patient come by and get a thyroid profile

## 2014-03-26 NOTE — Telephone Encounter (Signed)
Last thryoid panel 7/14. ntbs

## 2014-04-20 ENCOUNTER — Ambulatory Visit (INDEPENDENT_AMBULATORY_CARE_PROVIDER_SITE_OTHER): Payer: Medicare Other | Admitting: *Deleted

## 2014-04-20 DIAGNOSIS — E538 Deficiency of other specified B group vitamins: Secondary | ICD-10-CM

## 2014-04-20 NOTE — Progress Notes (Signed)
Patient ID: Luke Carter, male   DOB: 08/14/1920, 80 y.o.   MRN: [id number] Vitamin b12 given and tolerated well 

## 2014-04-20 NOTE — Patient Instructions (Signed)
Vitamin B12 Injections Every person needs vitamin B12. A deficiency develops when the body does not get enough of it. One way to overcome this is by getting B12 shots (injections). A B12 shot puts the vitamin directly into muscle tissue. This avoids any problems your body might have in absorbing it from food or a pill. In some people, the body has trouble using the vitamin correctly. This can cause a B12 deficiency. Not consuming enough of the vitamin can also cause a deficiency. Getting enough vitamin B12 can be hard for elderly people. Sometimes, they do not eat a well-balanced diet. The elderly are also more likely than younger people to have medical conditions or take medications that can lead to a deficiency. WHAT DOES VITAMIN B12 DO? Vitamin B12 does many things to help the body work right:  It helps the body make healthy red blood cells.  It helps maintain nerve cells.  It is involved in the body's process of converting food into energy (metabolism).  It is needed to make the genetic material in all cells (DNA). VITAMIN B12 FOOD SOURCES Most people get plenty of vitamin B12 through the foods they eat. It is present in:  Meat, fish, poultry, and eggs.  Milk and milk products.  It also is added when certain foods are made, including some breads, cereals and yogurts. The food is then called "fortified". CAUSES The most common causes of vitamin B12 deficiency are:  Pernicious anemia. The condition develops when the body cannot make enough healthy red blood cells. This stems from a lack of a protein made in the stomach (intrinsic factor). People without this protein cannot absorb enough vitamin B12 from food.  Malabsorption. This is when the body cannot absorb the vitamin. It can be caused by:  Pernicious anemia.  Surgery to remove part or all of the stomach can lead to malabsorption. Removal of part or all of the small intestine can also cause malabsorption.  Vegetarian diet.  People who are strict about not eating foods from animals could have trouble taking in enough vitamin B12 from diet alone.  Medications. Some medicines have been linked to B12 deficiency, such as Metformin (a drug prescribed for type 2 diabetes). Long-term use of stomach acid suppressants also can keep the vitamin from being absorbed.  Intestinal problems such as inflammatory bowel disease. If there are problems in the digestive tract, vitamin B12 may not be absorbed in good enough amounts. SYMPTOMS People who do not get enough B12 can develop problems. These can include:  Anemia. This is when the body has too few red blood cells. Red blood cells carry oxygen to the rest of the body. Without a healthy supply of red blood cells, people can feel:  Tired (fatigued).  Weak.  Severe anemia can cause:  Shortness of breath.  Dizziness.  Rapid heart rate.  Paleness.  Other Vitamin B12 deficiency symptoms include:  Diarrhea.  Numbness or tingling in the hands or feet.  Loss of appetite.  Confusion.  Sores on the tongue or in the mouth. LET YOUR CAREGIVER KNOW ABOUT:  Any allergies. It is very important to know if you are allergic or sensitive to cobalt. Vitamin B12 contains cobalt.  Any history of kidney disease.  All medications you are taking. Include prescription and over-the-counter medicines, herbs and creams.  Whether you are pregnant or breast-feeding.  If you have Leber's disease, a hereditary eye condition, vitamin B12 could make it worse. RISKS AND COMPLICATIONS Reactions to an injection are   usually temporary. They might include:  Pain at the injection site.  Redness, swelling or tenderness at the site.  Headache, dizziness or weakness.  Nausea, upset stomach or diarrhea.  Numbness or tingling.  Fever.  Joint pain.  Itching or rash. If a reaction does not go away in a short while, talk with your healthcare provider. A change in the way the shots are  given, or where they are given, might need to be made. BEFORE AN INJECTION To decide whether B12 injections are right for you, your healthcare provider will probably:  Ask about your medical history.  Ask questions about your diet.  Ask about symptoms such as:  Have you felt weak?  Do you feel unusually tired?  Do you get dizzy?  Order blood tests. These may include a test to:  Check the level of red cells in your blood.  Measure B12 levels.  Check for the presence of intrinsic factor. VITAMIN B12 INJECTIONS How often you will need a vitamin B12 injection will depend on how severe your deficiency is. This also will affect how long you will need to get them. People with pernicious anemia usually get injections for their entire life. Others might get them for a shorter period. For many people, injections are given daily or weekly for several weeks. Then, once B12 levels are normal, injections are given just once a month. If the cause of the deficiency can be fixed, the injections can be stopped. Talk with your healthcare provider about what you should expect. For an injection:  The injection site will be cleaned with an alcohol swab.  Your healthcare provider will insert a needle directly into a muscle. Most any muscle can be used. Most often, an arm muscle is used. A buttocks muscle can also be used. Many people say shots in that area are less painful.  A small adhesive bandage may be put over the injection site. It usually can be taken off in an hour or less. Injections can be given by your healthcare provider. In some cases, family members give them. Sometimes, people give them to themselves. Talk with your healthcare provider about what would be best for you. If someone other than your healthcare provider will be giving the shots, the person will need to be trained to give them correctly. HOME CARE INSTRUCTIONS   You can remove the adhesive bandage within an hour of getting a  shot.  You should be able to go about your normal activities right away.  Avoid drinking large amounts of alcohol while taking vitamin B12 shots. Alcohol can interfere with the body's use of the vitamin. SEEK MEDICAL CARE IF:   Pain, redness, swelling or tenderness at the injection site does not get better or gets worse.  Headache, dizziness or weakness does not go away.  You develop a fever of more than 100.5 F (38.1 C). SEEK IMMEDIATE MEDICAL CARE IF:   You have chest pain.  You develop shortness of breath.  You have muscle weakness that gets worse.  You develop numbness, weakness or tingling on one side or one area of the body.  You have symptoms of an allergic reaction, such as:  Hives.  Difficulty breathing.  Swelling of the lips, face, tongue or throat.  You develop a fever of more than 102.0 F (38.9 C). MAKE SURE YOU:   Understand these instructions.  Will watch your condition.  Will get help right away if you are not doing well or get worse. Document   Released: 10/09/2008 Document Revised: 10/05/2011 Document Reviewed: 10/09/2008 [employer] Patient Information 2015 [employer]. This information is not intended to replace advice given to you by your health care provider. Make sure you discuss any questions you have with your health care provider.  

## 2014-05-01 ENCOUNTER — Ambulatory Visit (INDEPENDENT_AMBULATORY_CARE_PROVIDER_SITE_OTHER): Payer: Medicare Other

## 2014-05-01 DIAGNOSIS — Z23 Encounter for immunization: Secondary | ICD-10-CM

## 2014-05-15 ENCOUNTER — Other Ambulatory Visit: Payer: Self-pay | Admitting: Family Medicine

## 2014-05-17 ENCOUNTER — Other Ambulatory Visit (INDEPENDENT_AMBULATORY_CARE_PROVIDER_SITE_OTHER): Payer: Medicare Other

## 2014-05-17 DIAGNOSIS — E559 Vitamin D deficiency, unspecified: Secondary | ICD-10-CM

## 2014-05-17 DIAGNOSIS — E039 Hypothyroidism, unspecified: Secondary | ICD-10-CM

## 2014-05-17 DIAGNOSIS — E785 Hyperlipidemia, unspecified: Secondary | ICD-10-CM

## 2014-05-17 LAB — POCT CBC
GRANULOCYTE PERCENT: 58.1 % (ref 37–80)
HEMATOCRIT: 36.5 % — AB (ref 43.5–53.7)
HEMOGLOBIN: 12.2 g/dL — AB (ref 14.1–18.1)
Lymph, poc: 2 (ref 0.6–3.4)
MCH, POC: 30.6 pg (ref 27–31.2)
MCHC: 33.4 g/dL (ref 31.8–35.4)
MCV: 91.7 fL (ref 80–97)
MPV: 8.4 fL (ref 0–99.8)
POC GRANULOCYTE: 3.1 (ref 2–6.9)
POC LYMPH %: 37.9 % (ref 10–50)
Platelet Count, POC: 165 10*3/uL (ref 142–424)
RBC: 4 M/uL — AB (ref 4.69–6.13)
RDW, POC: 12.8 %
WBC: 5.4 10*3/uL (ref 4.6–10.2)

## 2014-05-17 NOTE — Progress Notes (Signed)
LAB ONLY 

## 2014-05-19 LAB — THYROID PANEL WITH TSH
FREE THYROXINE INDEX: 2.4 (ref 1.2–4.9)
T3 Uptake Ratio: 33 % (ref 24–39)
T4, Total: 7.3 ug/dL (ref 4.5–12.0)
TSH: 4.88 u[IU]/mL — ABNORMAL HIGH (ref 0.450–4.500)

## 2014-05-19 LAB — BMP8+EGFR
BUN/Creatinine Ratio: 18 (ref 10–22)
BUN: 16 mg/dL (ref 10–36)
CO2: 26 mmol/L (ref 18–29)
Calcium: 9.1 mg/dL (ref 8.6–10.2)
Chloride: 104 mmol/L (ref 97–108)
Creatinine, Ser: 0.89 mg/dL (ref 0.76–1.27)
GFR calc Af Amer: 86 mL/min/{1.73_m2} (ref >59)
GFR calc non Af Amer: 75 mL/min/{1.73_m2} (ref >59)
GLUCOSE: 108 mg/dL — AB (ref 65–99)
Potassium: 3.9 mmol/L (ref 3.5–5.2)
Sodium: 143 mmol/L (ref 134–144)

## 2014-05-19 LAB — HEPATIC FUNCTION PANEL
ALBUMIN: 4.1 g/dL (ref 3.2–4.6)
ALT: 12 IU/L (ref 0–44)
AST: 17 IU/L (ref 0–40)
Alkaline Phosphatase: 50 IU/L (ref 39–117)
Bilirubin, Direct: 0.28 mg/dL (ref 0.00–0.40)
TOTAL PROTEIN: 6.5 g/dL (ref 6.0–8.5)
Total Bilirubin: 1.3 mg/dL — ABNORMAL HIGH (ref 0.0–1.2)

## 2014-05-19 LAB — NMR, LIPOPROFILE
CHOLESTEROL: 132 mg/dL (ref 100–199)
HDL Cholesterol by NMR: 41 mg/dL (ref >39)
HDL PARTICLE NUMBER: 28.3 umol/L — AB (ref >=30.5)
LDL Particle Number: 1076 nmol/L — ABNORMAL HIGH (ref <1000)
LDL Size: 20.5 nm (ref >20.5)
LDLC SERPL CALC-MCNC: 77 mg/dL (ref 0–99)
LP-IR SCORE: 36 (ref <=45)
Small LDL Particle Number: 623 nmol/L — ABNORMAL HIGH (ref <=527)
TRIGLYCERIDES BY NMR: 71 mg/dL (ref 0–149)

## 2014-05-19 LAB — VITAMIN D 25 HYDROXY (VIT D DEFICIENCY, FRACTURES): VIT D 25 HYDROXY: 51.9 ng/mL (ref 30.0–100.0)

## 2014-05-22 ENCOUNTER — Telehealth: Payer: Self-pay | Admitting: Family Medicine

## 2014-05-22 ENCOUNTER — Other Ambulatory Visit: Payer: Self-pay | Admitting: Family Medicine

## 2014-05-22 MED ORDER — LEVOTHYROXINE SODIUM 50 MCG PO TABS: ORAL_TABLET | ORAL | Status: DC

## 2014-05-22 NOTE — Telephone Encounter (Signed)
Message copied by Azalee CourseFULP, Mieczyslaw Stamas on Tue May 22, 2014  9:09 AM ------      Message from: Ernestina PennaMOORE, DONALD W      Created: Sun May 20, 2014  4:13 PM       The BS is slightly elevated at 108, all other kidney function tests are WNL      One liver function test is slightly elevated , all others are normal . We will continue to monitor this.      With advance d liver function tests , the total LDL particle is slightly elevated at 1076. The good cholesterol is low. All other numbers are wnl. Continue current treatment and do as well as possible with diet and Exercise      The TSH is slightly elevated ----Recheck the TSH in 6 weeks---For now continue current treatment.      The vit D is good---continue current treatment ------

## 2014-05-25 ENCOUNTER — Ambulatory Visit (INDEPENDENT_AMBULATORY_CARE_PROVIDER_SITE_OTHER): Payer: Medicare Other | Admitting: *Deleted

## 2014-05-25 DIAGNOSIS — E538 Deficiency of other specified B group vitamins: Secondary | ICD-10-CM

## 2014-05-25 NOTE — Patient Instructions (Signed)
Vitamin B12 Injections Every person needs vitamin B12. A deficiency develops when the body does not get enough of it. One way to overcome this is by getting B12 shots (injections). A B12 shot puts the vitamin directly into muscle tissue. This avoids any problems your body might have in absorbing it from food or a pill. In some people, the body has trouble using the vitamin correctly. This can cause a B12 deficiency. Not consuming enough of the vitamin can also cause a deficiency. Getting enough vitamin B12 can be hard for elderly people. Sometimes, they do not eat a well-balanced diet. The elderly are also more likely than younger people to have medical conditions or take medications that can lead to a deficiency. WHAT DOES VITAMIN B12 DO? Vitamin B12 does many things to help the body work right:  It helps the body make healthy red blood cells.  It helps maintain nerve cells.  It is involved in the body's process of converting food into energy (metabolism).  It is needed to make the genetic material in all cells (DNA). VITAMIN B12 FOOD SOURCES Most people get plenty of vitamin B12 through the foods they eat. It is present in:  Meat, fish, poultry, and eggs.  Milk and milk products.  It also is added when certain foods are made, including some breads, cereals and yogurts. The food is then called "fortified". CAUSES The most common causes of vitamin B12 deficiency are:  Pernicious anemia. The condition develops when the body cannot make enough healthy red blood cells. This stems from a lack of a protein made in the stomach (intrinsic factor). People without this protein cannot absorb enough vitamin B12 from food.  Malabsorption. This is when the body cannot absorb the vitamin. It can be caused by:  Pernicious anemia.  Surgery to remove part or all of the stomach can lead to malabsorption. Removal of part or all of the small intestine can also cause malabsorption.  Vegetarian diet.  People who are strict about not eating foods from animals could have trouble taking in enough vitamin B12 from diet alone.  Medications. Some medicines have been linked to B12 deficiency, such as Metformin (a drug prescribed for type 2 diabetes). Long-term use of stomach acid suppressants also can keep the vitamin from being absorbed.  Intestinal problems such as inflammatory bowel disease. If there are problems in the digestive tract, vitamin B12 may not be absorbed in good enough amounts. SYMPTOMS People who do not get enough B12 can develop problems. These can include:  Anemia. This is when the body has too few red blood cells. Red blood cells carry oxygen to the rest of the body. Without a healthy supply of red blood cells, people can feel:  Tired (fatigued).  Weak.  Severe anemia can cause:  Shortness of breath.  Dizziness.  Rapid heart rate.  Paleness.  Other Vitamin B12 deficiency symptoms include:  Diarrhea.  Numbness or tingling in the hands or feet.  Loss of appetite.  Confusion.  Sores on the tongue or in the mouth. LET YOUR CAREGIVER KNOW ABOUT:  Any allergies. It is very important to know if you are allergic or sensitive to cobalt. Vitamin B12 contains cobalt.  Any history of kidney disease.  All medications you are taking. Include prescription and over-the-counter medicines, herbs and creams.  Whether you are pregnant or breast-feeding.  If you have Leber's disease, a hereditary eye condition, vitamin B12 could make it worse. RISKS AND COMPLICATIONS Reactions to an injection are   usually temporary. They might include:  Pain at the injection site.  Redness, swelling or tenderness at the site.  Headache, dizziness or weakness.  Nausea, upset stomach or diarrhea.  Numbness or tingling.  Fever.  Joint pain.  Itching or rash. If a reaction does not go away in a short while, talk with your healthcare provider. A change in the way the shots are  given, or where they are given, might need to be made. BEFORE AN INJECTION To decide whether B12 injections are right for you, your healthcare provider will probably:  Ask about your medical history.  Ask questions about your diet.  Ask about symptoms such as:  Have you felt weak?  Do you feel unusually tired?  Do you get dizzy?  Order blood tests. These may include a test to:  Check the level of red cells in your blood.  Measure B12 levels.  Check for the presence of intrinsic factor. VITAMIN B12 INJECTIONS How often you will need a vitamin B12 injection will depend on how severe your deficiency is. This also will affect how long you will need to get them. People with pernicious anemia usually get injections for their entire life. Others might get them for a shorter period. For many people, injections are given daily or weekly for several weeks. Then, once B12 levels are normal, injections are given just once a month. If the cause of the deficiency can be fixed, the injections can be stopped. Talk with your healthcare provider about what you should expect. For an injection:  The injection site will be cleaned with an alcohol swab.  Your healthcare provider will insert a needle directly into a muscle. Most any muscle can be used. Most often, an arm muscle is used. A buttocks muscle can also be used. Many people say shots in that area are less painful.  A small adhesive bandage may be put over the injection site. It usually can be taken off in an hour or less. Injections can be given by your healthcare provider. In some cases, family members give them. Sometimes, people give them to themselves. Talk with your healthcare provider about what would be best for you. If someone other than your healthcare provider will be giving the shots, the person will need to be trained to give them correctly. HOME CARE INSTRUCTIONS   You can remove the adhesive bandage within an hour of getting a  shot.  You should be able to go about your normal activities right away.  Avoid drinking large amounts of alcohol while taking vitamin B12 shots. Alcohol can interfere with the body's use of the vitamin. SEEK MEDICAL CARE IF:   Pain, redness, swelling or tenderness at the injection site does not get better or gets worse.  Headache, dizziness or weakness does not go away.  You develop a fever of more than 100.5 F (38.1 C). SEEK IMMEDIATE MEDICAL CARE IF:   You have chest pain.  You develop shortness of breath.  You have muscle weakness that gets worse.  You develop numbness, weakness or tingling on one side or one area of the body.  You have symptoms of an allergic reaction, such as:  Hives.  Difficulty breathing.  Swelling of the lips, face, tongue or throat.  You develop a fever of more than 102.0 F (38.9 C). MAKE SURE YOU:   Understand these instructions.  Will watch your condition.  Will get help right away if you are not doing well or get worse. Document   Released: 10/09/2008 Document Revised: 10/05/2011 Document Reviewed: 10/09/2008 [employer] Patient Information 2015 [employer]. This information is not intended to replace advice given to you by your health care provider. Make sure you discuss any questions you have with your health care provider.  

## 2014-05-25 NOTE — Progress Notes (Signed)
Patient ID: Luke LouisRudolph R Marsiglia, male   DOB: 06-09-1921, 80 y.o.   MRN: [id number] Vitamin b12 given and tolerated well

## 2014-05-30 ENCOUNTER — Ambulatory Visit (INDEPENDENT_AMBULATORY_CARE_PROVIDER_SITE_OTHER): Payer: Medicare Other | Admitting: Family Medicine

## 2014-05-30 ENCOUNTER — Other Ambulatory Visit: Payer: Self-pay | Admitting: Family Medicine

## 2014-05-30 ENCOUNTER — Encounter: Payer: Self-pay | Admitting: Family Medicine

## 2014-05-30 VITALS — BP 118/65 | HR 60 | Temp 97.1°F | Ht 67.0 in | Wt 164.0 lb

## 2014-05-30 DIAGNOSIS — N390 Urinary tract infection, site not specified: Secondary | ICD-10-CM

## 2014-05-30 DIAGNOSIS — G8929 Other chronic pain: Secondary | ICD-10-CM

## 2014-05-30 DIAGNOSIS — M545 Low back pain, unspecified: Secondary | ICD-10-CM

## 2014-05-30 LAB — POCT URINALYSIS DIPSTICK
Bilirubin, UA: NEGATIVE
Glucose, UA: NEGATIVE
Ketones, UA: NEGATIVE
Nitrite, UA: NEGATIVE
Protein, UA: NEGATIVE
Spec Grav, UA: 1.015
Urobilinogen, UA: NEGATIVE
pH, UA: 6

## 2014-05-30 LAB — POCT UA - MICROSCOPIC ONLY
Bacteria, U Microscopic: NEGATIVE
Casts, Ur, LPF, POC: NEGATIVE
Crystals, Ur, HPF, POC: NEGATIVE
Mucus, UA: NEGATIVE
Yeast, UA: NEGATIVE

## 2014-05-30 MED ORDER — NAPROXEN 250 MG PO TABS: 250.0000 mg | ORAL_TABLET | Freq: Two times a day (BID) | ORAL | Status: DC

## 2014-05-30 NOTE — Progress Notes (Signed)
   Subjective:    Patient ID: Luke Carter, male    DOB: 17-Jun-1921, 80 y.o.   MRN: [id number]  HPI C/o back pain.  Review of Systems  Constitutional: Negative for fever.  HENT: Negative for ear pain.   Eyes: Negative for discharge.  Respiratory: Negative for cough.   Cardiovascular: Negative for chest pain.  Gastrointestinal: Negative for abdominal distention.  Endocrine: Negative for polyuria.  Genitourinary: Negative for difficulty urinating.  Musculoskeletal: Negative for gait problem and neck pain.  Skin: Negative for color change and rash.  Neurological: Negative for speech difficulty and headaches.  Psychiatric/Behavioral: Negative for agitation.       Objective:    BP 118/65 mmHg  Pulse 60  Temp(Src) 97.1 F (36.2 C) (Oral)  Ht 5\' 7"  (1.702 m)  Wt 164 lb (74.39 kg)  BMI 25.68 kg/m2 Physical Exam  Constitutional: He is oriented to person, place, and time. He appears well-developed and well-nourished.  HENT:  Head: Normocephalic and atraumatic.  Mouth/Throat: Oropharynx is clear and moist.  Eyes: Pupils are equal, round, and reactive to light.  Neck: Normal range of motion. Neck supple.  Cardiovascular: Normal rate and regular rhythm.   No murmur heard. Pulmonary/Chest: Effort normal and breath sounds normal.  Abdominal: Soft. Bowel sounds are normal. There is no tenderness.  Neurological: He is alert and oriented to person, place, and time.  Skin: Skin is warm and dry.  Psychiatric: He has a normal mood and affect.          Assessment & Plan:     ICD-9-CM ICD-10-CM   1. Chronic LBP 724.2 M54.5 POCT urinalysis dipstick   338.29 G89.29 POCT UA - Microscopic Only     naproxen (NAPROSYN) 250 MG tablet     No Follow-up on file.  Luke CanterWilliam J Mylena Sedberry FNP

## 2014-05-31 ENCOUNTER — Other Ambulatory Visit: Payer: Medicare Other

## 2014-05-31 DIAGNOSIS — N390 Urinary tract infection, site not specified: Secondary | ICD-10-CM

## 2014-05-31 NOTE — Progress Notes (Signed)
Lab only 

## 2014-06-02 LAB — URINE CULTURE

## 2014-06-04 ENCOUNTER — Other Ambulatory Visit: Payer: Self-pay | Admitting: Family Medicine

## 2014-06-04 ENCOUNTER — Telehealth: Payer: Self-pay | Admitting: *Deleted

## 2014-06-04 MED ORDER — CIPROFLOXACIN HCL 250 MG PO TABS: 250.0000 mg | ORAL_TABLET | Freq: Two times a day (BID) | ORAL | Status: DC

## 2014-06-04 NOTE — Telephone Encounter (Signed)
-----   Message from Deatra CanterWilliam J Oxford, FNP sent at 06/04/2014  8:01 AM EST ----- ua cx pos for uti and cipro sent to pharmacy, repeat ua in 2 weeks

## 2014-06-04 NOTE — Telephone Encounter (Signed)
Aware. 

## 2014-06-12 ENCOUNTER — Other Ambulatory Visit (INDEPENDENT_AMBULATORY_CARE_PROVIDER_SITE_OTHER): Payer: Medicare Other

## 2014-06-12 DIAGNOSIS — R799 Abnormal finding of blood chemistry, unspecified: Secondary | ICD-10-CM

## 2014-06-12 DIAGNOSIS — N390 Urinary tract infection, site not specified: Secondary | ICD-10-CM

## 2014-06-12 LAB — POCT URINALYSIS DIPSTICK
BILIRUBIN UA: NEGATIVE
Blood, UA: NEGATIVE
GLUCOSE UA: NEGATIVE
Ketones, UA: NEGATIVE
Leukocytes, UA: NEGATIVE
NITRITE UA: NEGATIVE
PH UA: 5
Protein, UA: NEGATIVE
Spec Grav, UA: 1.01
Urobilinogen, UA: NEGATIVE

## 2014-06-12 LAB — POCT UA - MICROSCOPIC ONLY
Bacteria, U Microscopic: NEGATIVE
CASTS, UR, LPF, POC: NEGATIVE
Crystals, Ur, HPF, POC: NEGATIVE
Mucus, UA: NEGATIVE
RBC, urine, microscopic: NEGATIVE
WBC, UR, HPF, POC: NEGATIVE
YEAST UA: NEGATIVE

## 2014-06-12 NOTE — Progress Notes (Signed)
Lab only 

## 2014-06-13 LAB — TSH: TSH: 4.69 u[IU]/mL — AB (ref 0.450–4.500)

## 2014-06-14 ENCOUNTER — Telehealth: Payer: Self-pay | Admitting: *Deleted

## 2014-06-14 NOTE — Telephone Encounter (Signed)
-----   Message from Ernestina Pennaonald W Moore, MD sent at 06/14/2014 10:43 AM EST ----- Marijean NiemannJaime, please find out the exact dosage of Synthroid that the patient is currently taking. And how he is taking it. I do not think that they make a 40 g.??????

## 2014-06-14 NOTE — Telephone Encounter (Signed)
LM 11/19-jhb Midori Dado B need to speak with pt about dose change

## 2014-06-14 NOTE — Telephone Encounter (Signed)
Spoke to pt and he takes 75mcg on Monday, Wednesday and Friday. He takes 50mcg on Tuesday, Thursday, Saturday and Sunday.

## 2014-06-16 ENCOUNTER — Telehealth: Payer: Self-pay | Admitting: Family Medicine

## 2014-06-16 NOTE — Telephone Encounter (Signed)
Luke MuirJamie spoke with patient in regards to thyroid medication

## 2014-06-16 NOTE — Telephone Encounter (Signed)
Pt aware to flip the directions - per MOORE and to rck level in 6 weeks

## 2014-06-27 ENCOUNTER — Other Ambulatory Visit: Payer: Self-pay | Admitting: Family Medicine

## 2014-07-06 ENCOUNTER — Encounter: Payer: Self-pay | Admitting: Family Medicine

## 2014-07-06 ENCOUNTER — Ambulatory Visit (INDEPENDENT_AMBULATORY_CARE_PROVIDER_SITE_OTHER): Payer: Medicare Other | Admitting: Family Medicine

## 2014-07-06 VITALS — BP 107/60 | HR 64 | Temp 97.1°F | Ht 67.0 in | Wt 163.0 lb

## 2014-07-06 DIAGNOSIS — R3981 Functional urinary incontinence: Secondary | ICD-10-CM

## 2014-07-06 DIAGNOSIS — E039 Hypothyroidism, unspecified: Secondary | ICD-10-CM

## 2014-07-06 DIAGNOSIS — E538 Deficiency of other specified B group vitamins: Secondary | ICD-10-CM

## 2014-07-06 DIAGNOSIS — E785 Hyperlipidemia, unspecified: Secondary | ICD-10-CM

## 2014-07-06 NOTE — Progress Notes (Signed)
Subjective:    Patient ID: Luke Carter, male    DOB: 08-22-1920, 80 y.o.   MRN: [id number]  HPI Pt here for follow up and management of chronic medical problems. The patient comes with his wife to the visit today and he has no specific complaints. His vital signs are stable. He is doing incredibly well for his age of 80 years. He continues to have problems with his incontinence. He is also the caregiver for his wife who cannot see or hear well. He lives at home with his wife by himself.  Patient Active Problem List   Diagnosis Date Noted  . Urinary incontinence 07/13/2013  . Vitamin B12 deficiency 02/15/2013  . Hyperlipemia 02/15/2013  . GERD (gastroesophageal reflux disease) 02/15/2013  . Hypothyroid 02/15/2013  . BPH (benign prostatic hypertrophy) 02/15/2013   Outpatient Encounter Prescriptions as of 07/06/2014  Medication Sig  . aspirin EC 81 MG tablet Take 81 mg by mouth daily.  . Calcium Carbonate-Vit D-Min 1200-1000 MG-UNIT CHEW Chew 1 tablet by mouth daily.  . finasteride (PROSCAR) 5 MG tablet Take 5 mg by mouth daily.  . fish oil-omega-3 fatty acids 1000 MG capsule Take 2 g by mouth daily.  Marland Kitchen. levothyroxine (SYNTHROID, LEVOTHROID) 50 MCG tablet Take 50 mcg. Mon, Wed, and Friday. 75 mcg all other days  . Multiple Vitamin (MULTIVITAMIN) tablet Take 1 tablet by mouth daily.  Marland Kitchen. omeprazole (PRILOSEC) 20 MG capsule Take 20 mg by mouth 2 (two) times daily.  . pravastatin (PRAVACHOL) 40 MG tablet Take 40 mg by mouth daily.  Marland Kitchen. terazosin (HYTRIN) 5 MG capsule Take 5 mg by mouth at bedtime.  . [DISCONTINUED] ciprofloxacin (CIPRO) 250 MG tablet Take 1 tablet (250 mg total) by mouth 2 (two) times daily.  . [DISCONTINUED] naproxen (NAPROSYN) 250 MG tablet Take 1 tablet (250 mg total) by mouth 2 (two) times daily with a meal.  . [DISCONTINUED] niacin (NIASPAN) 1000 MG CR tablet Take 1,000 mg by mouth at bedtime.  . [DISCONTINUED] TOVIAZ 8 MG TB24     Review of Systems  Constitutional:  Negative.   HENT: Negative.   Eyes: Negative.   Respiratory: Negative.   Cardiovascular: Negative.   Gastrointestinal: Negative.   Endocrine: Negative.   Genitourinary: Negative.   Musculoskeletal: Negative.   Skin: Negative.   Allergic/Immunologic: Negative.   Neurological: Negative.   Hematological: Negative.   Psychiatric/Behavioral: Negative.        Objective:   Physical Exam  Constitutional: He is oriented to person, place, and time. He appears well-developed and well-nourished.  Patient is alert and looks and acts much younger than his stated age of 80  HENT:  Head: Normocephalic and atraumatic.  Right Ear: External ear normal.  Left Ear: External ear normal.  Mouth/Throat: Oropharynx is clear and moist. No oropharyngeal exudate.  Nasal congestion bilaterally  Eyes: Conjunctivae and EOM are normal. Pupils are equal, round, and reactive to light. Right eye exhibits no discharge. Left eye exhibits no discharge. No scleral icterus.  Neck: Normal range of motion. Neck supple. No thyromegaly present.  Cardiovascular: Normal rate and regular rhythm.   No murmur heard. Heart sounds were somewhat distant. The rhythm is regular at about 60/m  Pulmonary/Chest: Effort normal and breath sounds normal. No respiratory distress. He has no wheezes. He has no rales. He exhibits no tenderness.  Clear anteriorly and posteriorly without axillary adenopathy  Abdominal: Soft. Bowel sounds are normal. He exhibits no mass. There is no tenderness. There is no rebound  and no guarding.  The patient has a large right inguinal hernia which reduces when supine  Musculoskeletal: Normal range of motion. He exhibits no edema.  Range of motion is good but patient is slow moving with walking.  Lymphadenopathy:    He has no cervical adenopathy.  Neurological: He is alert and oriented to person, place, and time. He has normal reflexes. No cranial nerve deficit.  Skin: Skin is warm and dry. No rash noted.  No erythema. No pallor.  Psychiatric: He has a normal mood and affect. His behavior is normal. Judgment and thought content normal.  Nursing note and vitals reviewed.  BP 107/60 mmHg  Pulse 64  Temp(Src) 97.1 F (36.2 C) (Oral)  Ht 5\' 7"  (1.702 m)  Wt 163 lb (73.936 kg)  BMI 25.52 kg/m2        Assessment & Plan:  1. B12 deficiency  2. Hypothyroidism, unspecified hypothyroidism type - Thyroid Panel With TSH  3. Hyperlipemia  4. Functional urinary incontinence -Sees a urologist regularly  No orders of the defined types were placed in this encounter.   Patient Instructions                       Medicare Annual Wellness Visit  [place] and the medical providers at [facility] strive to bring you the best medical care.  In doing so we not only want to address your current medical conditions and concerns but also to detect new conditions early and prevent illness, disease and health-related problems.    Medicare offers a yearly Wellness Visit which allows our clinical staff to assess your need for preventative services including immunizations, lifestyle education, counseling to decrease risk of preventable diseases and screening for fall risk and other medical concerns.    This visit is provided free of charge (no copay) for all Medicare recipients. The clinical pharmacists at [facility] have begun to conduct these Wellness Visits which will also include a thorough review of all your medications.    As you primary medical provider recommend that you make an appointment for your Annual Wellness Visit if you have not done so already this year.  You may set up this appointment before you leave today or you may call back [phone] and schedule an appointment.  Please make sure when you call that you mention that you are scheduling your Annual Wellness Visit with the clinical pharmacist so that the appointment may be made for the proper  length of time.     Continue current medications. Continue good therapeutic lifestyle changes which include good diet and exercise. Fall precautions discussed with patient. If an FOBT was given today- please return it to our front desk. If you are over 78 years old - you may need Prevnar 13 or the adult Pneumonia vaccine.  Flu Shots will be available at our office starting mid- September. Please call and schedule a FLU CLINIC APPOINTMENT.   Because of your voiding difficulties, be sure and call the urologist and see if self-catheterization is a possibility for you   Nyra Capeson W. Moore MD

## 2014-07-06 NOTE — Patient Instructions (Addendum)
Medicare Annual Wellness Visit  [place] and the medical providers at [facility] strive to bring you the best medical care.  In doing so we not only want to address your current medical conditions and concerns but also to detect new conditions early and prevent illness, disease and health-related problems.    Medicare offers a yearly Wellness Visit which allows our clinical staff to assess your need for preventative services including immunizations, lifestyle education, counseling to decrease risk of preventable diseases and screening for fall risk and other medical concerns.    This visit is provided free of charge (no copay) for all Medicare recipients. The clinical pharmacists at [facility] have begun to conduct these Wellness Visits which will also include a thorough review of all your medications.    As you primary medical provider recommend that you make an appointment for your Annual Wellness Visit if you have not done so already this year.  You may set up this appointment before you leave today or you may call back [phone] and schedule an appointment.  Please make sure when you call that you mention that you are scheduling your Annual Wellness Visit with the clinical pharmacist so that the appointment may be made for the proper length of time.     Continue current medications. Continue good therapeutic lifestyle changes which include good diet and exercise. Fall precautions discussed with patient. If an FOBT was given today- please return it to our front desk. If you are over 78 years old - you may need Prevnar 13 or the adult Pneumonia vaccine.  Flu Shots will be available at our office starting mid- September. Please call and schedule a FLU CLINIC APPOINTMENT.   Because of your voiding difficulties, be sure and call the urologist and see if self-catheterization is a possibility for you

## 2014-07-07 LAB — THYROID PANEL WITH TSH
Free Thyroxine Index: 2 (ref 1.2–4.9)
T3 UPTAKE RATIO: 31 % (ref 24–39)
T4, Total: 6.6 ug/dL (ref 4.5–12.0)
TSH: 3.2 u[IU]/mL (ref 0.450–4.500)

## 2014-07-09 ENCOUNTER — Other Ambulatory Visit: Payer: Medicare Other

## 2014-07-09 ENCOUNTER — Telehealth: Payer: Self-pay | Admitting: *Deleted

## 2014-07-09 DIAGNOSIS — Z1212 Encounter for screening for malignant neoplasm of rectum: Secondary | ICD-10-CM

## 2014-07-09 NOTE — Telephone Encounter (Signed)
-----   Message from Ernestina Pennaonald W Moore, MD sent at 07/08/2014 12:56 PM EST ----- All thyroid function tests are within normal limits-----the patient should continue with his current thyroid treatment regimen and we should recheck a thyroid profile at his next visit.

## 2014-07-09 NOTE — Progress Notes (Signed)
Lab only 

## 2014-07-09 NOTE — Telephone Encounter (Signed)
Aware of lab results  

## 2014-07-11 LAB — FECAL OCCULT BLOOD, IMMUNOCHEMICAL: Fecal Occult Bld: NEGATIVE

## 2014-07-30 ENCOUNTER — Other Ambulatory Visit: Payer: Medicare Other

## 2014-08-23 ENCOUNTER — Ambulatory Visit (INDEPENDENT_AMBULATORY_CARE_PROVIDER_SITE_OTHER): Payer: Medicare Other | Admitting: *Deleted

## 2014-08-23 ENCOUNTER — Encounter (INDEPENDENT_AMBULATORY_CARE_PROVIDER_SITE_OTHER): Payer: Self-pay

## 2014-08-23 DIAGNOSIS — E538 Deficiency of other specified B group vitamins: Secondary | ICD-10-CM

## 2014-08-23 NOTE — Progress Notes (Signed)
Vitamin b12 injection given and tolerated well.  

## 2014-08-24 ENCOUNTER — Ambulatory Visit: Payer: Self-pay

## 2014-09-06 ENCOUNTER — Encounter: Payer: Self-pay | Admitting: Family Medicine

## 2014-09-06 ENCOUNTER — Ambulatory Visit (INDEPENDENT_AMBULATORY_CARE_PROVIDER_SITE_OTHER): Payer: Medicare Other

## 2014-09-06 ENCOUNTER — Other Ambulatory Visit: Payer: Medicare Other

## 2014-09-06 ENCOUNTER — Ambulatory Visit (INDEPENDENT_AMBULATORY_CARE_PROVIDER_SITE_OTHER): Payer: Medicare Other | Admitting: Family Medicine

## 2014-09-06 VITALS — BP 137/61 | HR 75 | Temp 97.4°F | Ht 67.0 in | Wt 164.0 lb

## 2014-09-06 DIAGNOSIS — R159 Full incontinence of feces: Secondary | ICD-10-CM

## 2014-09-06 DIAGNOSIS — N400 Enlarged prostate without lower urinary tract symptoms: Secondary | ICD-10-CM

## 2014-09-06 DIAGNOSIS — R32 Unspecified urinary incontinence: Secondary | ICD-10-CM

## 2014-09-06 DIAGNOSIS — R109 Unspecified abdominal pain: Secondary | ICD-10-CM

## 2014-09-06 DIAGNOSIS — K5901 Slow transit constipation: Secondary | ICD-10-CM

## 2014-09-06 LAB — POCT UA - MICROSCOPIC ONLY
Casts, Ur, LPF, POC: NEGATIVE
Crystals, Ur, HPF, POC: NEGATIVE
Yeast, UA: NEGATIVE

## 2014-09-06 LAB — POCT URINALYSIS DIPSTICK
BILIRUBIN UA: NEGATIVE
Glucose, UA: NEGATIVE
KETONES UA: NEGATIVE
Nitrite, UA: POSITIVE
PROTEIN UA: NEGATIVE
Spec Grav, UA: 1.02
Urobilinogen, UA: NEGATIVE
pH, UA: 6

## 2014-09-06 NOTE — Patient Instructions (Addendum)
  Take MiraLAX daily Use the enema 1--- if you're not successful with getting emptied out well with the enema you may repeat this 1 Continue to drink plenty of liquids Call me this weekend or on Monday and if things are not a lot better I will then call the urologist regarding your urinary tract symptoms and the stool leakage

## 2014-09-06 NOTE — Progress Notes (Signed)
Lab only 

## 2014-09-06 NOTE — Progress Notes (Signed)
Subjective:    Patient ID: Luke Carter, male    DOB: [birth year], 81 y.o.   MRN: [id number]  HPI Patient here today for problems with urine and GI discomfort. The patient has had chronic problems with voiding because of a very large prostate. He called me last night and said he is going to the bathroom frequently and only passing small amounts of urine and there is some discomfort but not a lot. He is been followed by the urologist regularly.he also says that recently when he is straining to go void that he is having some fecal incontinence also and this is very concerning to him.         Patient Active Problem List   Diagnosis Date Noted  . Urinary incontinence 07/13/2013  . Vitamin B12 deficiency 02/15/2013  . Hyperlipemia 02/15/2013  . GERD (gastroesophageal reflux disease) 02/15/2013  . Hypothyroid 02/15/2013  . BPH (benign prostatic hypertrophy) 02/15/2013   Outpatient Encounter Prescriptions as of 09/06/2014  Medication Sig  . aspirin EC 81 MG tablet Take 81 mg by mouth daily.  . Calcium Carbonate-Vit D-Min 1200-1000 MG-UNIT CHEW Chew 1 tablet by mouth daily.  . finasteride (PROSCAR) 5 MG tablet Take 5 mg by mouth daily.  . fish oil-omega-3 fatty acids 1000 MG capsule Take 2 g by mouth daily.  Marland Kitchen levothyroxine (SYNTHROID, LEVOTHROID) 50 MCG tablet Take 50 mcg. Mon, Wed, and Friday. 75 mcg all other days  . Multiple Vitamin (MULTIVITAMIN) tablet Take 1 tablet by mouth daily.  Marland Kitchen omeprazole (PRILOSEC) 20 MG capsule Take 20 mg by mouth 2 (two) times daily.  . pravastatin (PRAVACHOL) 40 MG tablet Take 40 mg by mouth daily.  Marland Kitchen terazosin (HYTRIN) 5 MG capsule Take 5 mg by mouth at bedtime.    Review of Systems  Constitutional: Negative.   HENT: Negative.   Eyes: Negative.   Respiratory: Negative.   Cardiovascular: Negative.   Gastrointestinal: Positive for abdominal pain.       Some fecal incontience  Endocrine: Negative.   Genitourinary: Positive for dysuria.   Incontience  Musculoskeletal: Negative.   Skin: Negative.   Allergic/Immunologic: Negative.   Neurological: Negative.   Hematological: Negative.   Psychiatric/Behavioral: Negative.        Objective:   Physical Exam  Constitutional: He is oriented to person, place, and time. He appears well-developed and well-nourished. No distress.  For his age of 59  Abdominal: Soft. He exhibits no distension and no mass. There is tenderness. There is no rebound and no guarding.  Bowel sounds are quiet there is just some general lower abdominal tenderness.  Genitourinary: Rectum normal. No penile tenderness.  The prostate and is enlarged and there is no rectal masses. There is stool high up in the rectum and it is soft. The foreskin is scarred together and the glans is not visible. There is no testicular enlargement. There are no testicular masses. He does have a large right inguinal hernia but this is reducible.  Musculoskeletal: Normal range of motion.  Neurological: He is alert and oriented to person, place, and time.  Skin: Skin is warm and dry. No rash noted.  Psychiatric: He has a normal mood and affect. His behavior is normal. Judgment and thought content normal.  Nursing note and vitals reviewed.   [facility] reading (PRIMARY) by  Dr.Arling Cerone-abdomen-a lot of stool and degenerative changes and lumbar spine  Assessment & Plan:  1. Urinary incontinence, unspecified incontinence type This is secondary to an enlarged prostate -We will check a urine and make sure that there is no infection - DG Abd 1 View; Future - POCT UA - Microscopic Only - POCT urinalysis dipstick - Urine culture  2. Fecal incontinence -The x-ray revealed a lot of feces in the intestinal tract - DG Abd 1 View; Future - POCT UA - Microscopic Only - POCT urinalysis dipstick - Urine culture  3. Abdominal discomfort -We will ask the patient to use a fleets enema or soapsuds enema 1  today and may repeat be repeated tomorrow if results are not obtained today -We will also ask that he drink plenty of liquids -We will also ask him to take MiraLAX daily - DG Abd 1 View; Future - POCT UA - Microscopic Only - POCT urinalysis dipstick - Urine culture  4. Slow transit constipation -MiraLAX daily -Enema today and may be repeated 1 tomorrow -If he has trouble administering this he will call us  5. BPH (benign prostatic hyperplasia) -We will talk with him again over the weekend or the first of the week and if problems continue we will call his urologist and discuss the situation with him especially regarding the foreskin adhesion. And especially regarding his frequency keeping him awake all night.  Patient Instructions   Take MiraLAX daily Use the enema 1--- if you're not successful with getting emptied out well with the enema you may repeat this 1 Continue to drink plenty of liquids Call me this weekend or on Monday and if things are not a lot better I will then call the urologist regarding your urinary tract symptoms and the stool leakage   Nyra Capeson W. Pallie Swigert MD

## 2014-09-07 LAB — URINE CULTURE

## 2014-09-08 ENCOUNTER — Emergency Department (HOSPITAL_COMMUNITY): Payer: Medicare Other

## 2014-09-08 ENCOUNTER — Emergency Department (HOSPITAL_COMMUNITY)
Admission: EM | Admit: 2014-09-08 | Discharge: 2014-09-08 | Disposition: A | Discharge status: Home / Self Care | Payer: Medicare Other | Attending: Emergency Medicine | Admitting: Emergency Medicine

## 2014-09-08 ENCOUNTER — Encounter (HOSPITAL_COMMUNITY): Payer: Self-pay | Admitting: Emergency Medicine

## 2014-09-08 DIAGNOSIS — Z79899 Other long term (current) drug therapy: Secondary | ICD-10-CM | POA: Diagnosis not present

## 2014-09-08 DIAGNOSIS — E785 Hyperlipidemia, unspecified: Secondary | ICD-10-CM | POA: Insufficient documentation

## 2014-09-08 DIAGNOSIS — R339 Retention of urine, unspecified: Secondary | ICD-10-CM | POA: Diagnosis not present

## 2014-09-08 DIAGNOSIS — K5900 Constipation, unspecified: Secondary | ICD-10-CM | POA: Diagnosis present

## 2014-09-08 DIAGNOSIS — N400 Enlarged prostate without lower urinary tract symptoms: Secondary | ICD-10-CM | POA: Diagnosis not present

## 2014-09-08 DIAGNOSIS — Z7982 Long term (current) use of aspirin: Secondary | ICD-10-CM | POA: Insufficient documentation

## 2014-09-08 HISTORY — DX: Benign prostatic hyperplasia without lower urinary tract symptoms: N40.0

## 2014-09-08 LAB — COMPREHENSIVE METABOLIC PANEL
ALT: 17 U/L (ref 0–53)
ANION GAP: 6 (ref 5–15)
AST: 21 U/L (ref 0–37)
Albumin: 4.2 g/dL (ref 3.5–5.2)
Alkaline Phosphatase: 51 U/L (ref 39–117)
BUN: 25 mg/dL — ABNORMAL HIGH (ref 6–23)
CO2: 27 mmol/L (ref 19–32)
Calcium: 9.5 mg/dL (ref 8.4–10.5)
Chloride: 106 mmol/L (ref 96–112)
Creatinine, Ser: 0.84 mg/dL (ref 0.50–1.35)
GFR calc non Af Amer: 74 mL/min — ABNORMAL LOW (ref >90)
GFR, EST AFRICAN AMERICAN: 86 mL/min — AB (ref >90)
Glucose, Bld: 154 mg/dL — ABNORMAL HIGH (ref 70–99)
Potassium: 3.9 mmol/L (ref 3.5–5.1)
Sodium: 139 mmol/L (ref 135–145)
Total Bilirubin: 1.3 mg/dL — ABNORMAL HIGH (ref 0.3–1.2)
Total Protein: 7.1 g/dL (ref 6.0–8.3)

## 2014-09-08 LAB — URINALYSIS, ROUTINE W REFLEX MICROSCOPIC
Bilirubin Urine: NEGATIVE
Glucose, UA: NEGATIVE mg/dL
KETONES UR: NEGATIVE mg/dL
LEUKOCYTES UA: NEGATIVE
NITRITE: NEGATIVE
PH: 6 (ref 5.0–8.0)
PROTEIN: NEGATIVE mg/dL
SPECIFIC GRAVITY, URINE: 1.021 (ref 1.005–1.030)
Urobilinogen, UA: 0.2 mg/dL (ref 0.0–1.0)

## 2014-09-08 LAB — CBC WITH DIFFERENTIAL/PLATELET
Basophils Absolute: 0 10*3/uL (ref 0.0–0.1)
Basophils Relative: 0 % (ref 0–1)
EOS PCT: 0 % (ref 0–5)
Eosinophils Absolute: 0 10*3/uL (ref 0.0–0.7)
HCT: 37.5 % — ABNORMAL LOW (ref 39.0–52.0)
Hemoglobin: 12.5 g/dL — ABNORMAL LOW (ref 13.0–17.0)
LYMPHS ABS: 0.9 10*3/uL (ref 0.7–4.0)
LYMPHS PCT: 11 % — AB (ref 12–46)
MCH: 31.1 pg (ref 26.0–34.0)
MCHC: 33.3 g/dL (ref 30.0–36.0)
MCV: 93.3 fL (ref 78.0–100.0)
MONOS PCT: 8 % (ref 3–12)
Monocytes Absolute: 0.7 10*3/uL (ref 0.1–1.0)
NEUTROS PCT: 81 % — AB (ref 43–77)
Neutro Abs: 7.1 10*3/uL (ref 1.7–7.7)
Platelets: 178 10*3/uL (ref 150–400)
RBC: 4.02 MIL/uL — ABNORMAL LOW (ref 4.22–5.81)
RDW: 13 % (ref 11.5–15.5)
WBC: 8.7 10*3/uL (ref 4.0–10.5)

## 2014-09-08 LAB — URINE MICROSCOPIC-ADD ON

## 2014-09-08 MED ORDER — MORPHINE SULFATE 4 MG/ML IJ SOLN
4.0000 mg | Freq: Once | INTRAMUSCULAR | Status: AC
  Administered 2014-09-08: 4 mg via INTRAVENOUS
  Filled 2014-09-08: qty 1

## 2014-09-08 MED ORDER — LIDOCAINE HCL 2 % EX GEL
CUTANEOUS | Status: AC
  Filled 2014-09-08: qty 10

## 2014-09-08 MED ORDER — SODIUM CHLORIDE 0.9 % IV BOLUS (SEPSIS)
500.0000 mL | Freq: Once | INTRAVENOUS | Status: AC
  Administered 2014-09-08: 500 mL via INTRAVENOUS

## 2014-09-08 NOTE — Discharge Instructions (Signed)
Take miralax hourly until you have bowel movement then daily.   Keep foley in.   See urology next week.   Stay hydrated.   See GI doctor if you are still constipated in a week.   Return to ER if you have worse abdominal pain, fever, foley not draining.

## 2014-09-08 NOTE — ED Notes (Signed)
Pt from home c/o urinary frequency and constipation. Pt was referred here by MD Christell ConstantMoore from [facility]. Pt has had these problems ongoing for years however over the past several days has gotten progressively worse. When this RN spoke to MD Christell ConstantMoore over the phone he recommended a CT of Abdomen and involve urologist.

## 2014-09-08 NOTE — ED Notes (Signed)
Bladder scan 452ml

## 2014-09-08 NOTE — ED Provider Notes (Signed)
CSN: [id number]     Arrival date & time 09/08/14  1801 History   First MD Initiated Contact with Patient 09/08/14 1826     Chief Complaint  Patient presents with  . urinary symptoms   . Constipation     (Consider location/radiation/quality/duration/timing/severity/associated sxs/prior Treatment) The history is provided by the patient.  Luke Carter is a 81 y.o. male hx HL, BPH, constipation here with trouble urinating, constipation. He has been constipated for the last 2 weeks. Last normal bowel movement was 2 weeks ago. He had some hard stools last several days. Try MiraLAX yesterday and today as well as an enema and Dulcolax with no relief. Also he is incontinent at baseline but today was unable to urinate. Denies any fevers or chills or vomiting. He does complain of diffuse lower abdominal pain. Has previous bladder surgeries but no abdominal surgeries.    Past Medical History  Diagnosis Date  . Hyperlipidemia   . BPH (benign prostatic hyperplasia)    History reviewed. No pertinent past surgical history. No family history on file. History  Substance Use Topics  . Smoking status: Never Smoker   . Smokeless tobacco: Not on file  . Alcohol Use: No    Review of Systems  Gastrointestinal: Positive for abdominal pain and constipation.  Genitourinary: Positive for difficulty urinating.  All other systems reviewed and are negative.     Allergies  Review of patient's allergies indicates no known allergies.  Home Medications   Prior to Admission medications   Medication Sig Start Date End Date Taking? Authorizing Provider  aspirin EC 81 MG tablet Take 81 mg by mouth daily.   Yes Historical Provider, MD  bisacodyl (DULCOLAX) 5 MG EC tablet Take 10 mg by mouth daily as needed for moderate constipation or severe constipation.   Yes Historical Provider, MD  Calcium Carbonate-Vit D-Min 1200-1000 MG-UNIT CHEW Chew 1 tablet by mouth daily.   Yes Historical Provider, MD   finasteride (PROSCAR) 5 MG tablet Take 5 mg by mouth daily.   Yes Historical Provider, MD  fish oil-omega-3 fatty acids 1000 MG capsule Take 2 g by mouth daily.   Yes Historical Provider, MD  levothyroxine (SYNTHROID, LEVOTHROID) 50 MCG tablet Take 50 mcg. Mon, Wed, and Friday. 75 mcg all other days 06/27/14  Yes Mary-Margaret Daphine DeutscherMartin, FNP  Multiple Vitamin (MULTIVITAMIN) tablet Take 1 tablet by mouth daily.   Yes Historical Provider, MD  omeprazole (PRILOSEC) 20 MG capsule Take 20 mg by mouth 2 (two) times daily.   Yes Historical Provider, MD  polyethylene glycol (MIRALAX / GLYCOLAX) packet Take 17 g by mouth daily as needed for moderate constipation or severe constipation.   Yes Historical Provider, MD  pravastatin (PRAVACHOL) 40 MG tablet Take 40 mg by mouth daily.   Yes Historical Provider, MD  terazosin (HYTRIN) 5 MG capsule Take 5 mg by mouth at bedtime.   Yes Historical Provider, MD   BP 131/66 mmHg  Pulse 70  Temp(Src) 97.8 F (36.6 C) (Oral)  Resp 16  SpO2 96% Physical Exam  Constitutional: He is oriented to person, place, and time.  Uncomfortable, chronically ill   HENT:  Head: Normocephalic.  MM slightly dry   Eyes: Conjunctivae are normal. Pupils are equal, round, and reactive to light.  Neck: Normal range of motion. Neck supple.  Cardiovascular: Normal rate, regular rhythm and normal heart sounds.   Pulmonary/Chest: Effort normal and breath sounds normal. No respiratory distress. He has no wheezes. He has no rales.  Abdominal: Soft. Bowel sounds are normal.  + diffuse lower abdominal tenderness, worse in suprapubic and LLQ. No rebound   Genitourinary:  Prostate enlarged, nontender. No stool impaction   Musculoskeletal: Normal range of motion. He exhibits no edema or tenderness.  Neurological: He is alert and oriented to person, place, and time. No cranial nerve deficit. Coordination normal.  Skin: Skin is warm and dry.  Psychiatric: He has a normal mood and affect. His  behavior is normal. Judgment and thought content normal.  Nursing note and vitals reviewed.   ED Course  BLADDER CATHETERIZATION Date/Time: 09/08/2014 10:17 PM Performed by: Richardean Canal Authorized by: Richardean Canal Consent: Verbal consent obtained. Risks and benefits: risks, benefits and alternatives were discussed Consent given by: patient Patient understanding: patient states understanding of the procedure being performed Patient consent: the patient's understanding of the procedure matches consent given Procedure consent: procedure consent matches procedure scheduled Relevant documents: relevant documents present and verified Test results: test results available and properly labeled Patient identity confirmed: verbally with patient and arm band Time out: Immediately prior to procedure a "time out" was called to verify the correct patient, procedure, equipment, support staff and site/side marked as required. Indications: urinary retention Local anesthesia used: yes Local anesthetic: lidocaine 2% without epinephrine Preparation: Patient was prepped and draped in the usual sterile fashion. Catheter insertion: indwelling Catheter type: Foley Catheter size: 20 Fr Complicated insertion: yes Altered anatomy: no Bladder irrigation: no Number of attempts: 1 Urine volume: 500 ml Urine characteristics: clear Patient tolerance: Patient tolerated the procedure well with no immediate complications   (including critical care time)    Labs Review Labs Reviewed  CBC WITH DIFFERENTIAL/PLATELET - Abnormal; Notable for the following:    RBC 4.02 (*)    Hemoglobin 12.5 (*)    HCT 37.5 (*)    Neutrophils Relative % 81 (*)    Lymphocytes Relative 11 (*)    All other components within normal limits  COMPREHENSIVE METABOLIC PANEL - Abnormal; Notable for the following:    Glucose, Bld 154 (*)    BUN 25 (*)    Total Bilirubin 1.3 (*)    GFR calc non Af Amer 74 (*)    GFR calc Af Amer 86  (*)    All other components within normal limits  URINALYSIS, ROUTINE W REFLEX MICROSCOPIC - Abnormal; Notable for the following:    Hgb urine dipstick LARGE (*)    All other components within normal limits  URINE MICROSCOPIC-ADD ON    Imaging Review Dg Abd Acute W/chest  09/08/2014   CLINICAL DATA:  81 year old male with subacute abdominal pain and constipation. Initial encounter.  EXAM: ACUTE ABDOMEN SERIES (ABDOMEN 2 VIEW & CHEST 1 VIEW)  COMPARISON:  09/06/2014 abdominal radiographs. 02/15/2013 and prior chest radiographs. 08/06/2009 chest CT.  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  The cardiomediastinal silhouette is unchanged.  There is no evidence of airspace disease, pleural effusion or pneumothorax.  The bowel gas pattern is unremarkable.  There is no evidence of bowel obstruction or pneumoperitoneum.  No suspicious calcifications are identified.  No acute bony abnormalities are identified. Degenerative changes within the lumbar spine again noted.  IMPRESSION: No evidence of acute abnormality.  Unremarkable bowel gas pattern.  Large right Morgagni hernia again noted.   Electronically Signed   By: Harmon Pier M.D.   On: 09/08/2014 19:38     EKG Interpretation None      MDM  Final diagnoses:  Constipation    Luke Carter is a 81 y.o. male here with abdominal pain, difficulty urinating, constipation. Consider SBO vs urinary retention vs constipation. Will get labs, xrays, bladder scan.   7:30 PM Bladder scan showed > 450 cc. Will place foley.   8:30 PM Nurse unable to place foley. I placed 20 F coude cath. Drained out 800 cc then stopped. Rectal exam showed no stool impaction. I think constipation is likely from urinary retention. Has BPH likely causing his retention. Recommend f/u with urology and continue miralax.      Richardean Canal, MD 09/08/14 2228

## 2014-09-08 NOTE — ED Notes (Signed)
Bed: ZO10WA05 Expected date:  Expected time:  Means of arrival:  Comments: Hold tri1

## 2014-09-08 NOTE — ED Notes (Signed)
Made 2 attempts at inserting foley catheter. Felt significant resistance at prostate. Rn made aware. EDP Yao at bedside to attempt insertion

## 2014-10-01 ENCOUNTER — Telehealth: Payer: Self-pay | Admitting: Family Medicine

## 2014-10-01 NOTE — Telephone Encounter (Signed)
He was calling for his wife.

## 2014-10-11 ENCOUNTER — Other Ambulatory Visit: Payer: Self-pay | Admitting: Urology

## 2014-10-11 ENCOUNTER — Encounter (HOSPITAL_BASED_OUTPATIENT_CLINIC_OR_DEPARTMENT_OTHER)
Admission: RE | Disposition: A | Discharge status: Home / Self Care | Payer: Self-pay | Source: Ambulatory Visit | Attending: Urology

## 2014-10-11 ENCOUNTER — Ambulatory Visit (HOSPITAL_BASED_OUTPATIENT_CLINIC_OR_DEPARTMENT_OTHER): Payer: Medicare Other | Admitting: Anesthesiology

## 2014-10-11 ENCOUNTER — Encounter (HOSPITAL_BASED_OUTPATIENT_CLINIC_OR_DEPARTMENT_OTHER): Payer: Self-pay

## 2014-10-11 ENCOUNTER — Ambulatory Visit (HOSPITAL_BASED_OUTPATIENT_CLINIC_OR_DEPARTMENT_OTHER)
Admission: RE | Admit: 2014-10-11 | Discharge: 2014-10-11 | Disposition: A | Discharge status: Home / Self Care | Payer: Medicare Other | Source: Ambulatory Visit | Attending: Urology | Admitting: Urology

## 2014-10-11 DIAGNOSIS — K219 Gastro-esophageal reflux disease without esophagitis: Secondary | ICD-10-CM | POA: Insufficient documentation

## 2014-10-11 DIAGNOSIS — E119 Type 2 diabetes mellitus without complications: Secondary | ICD-10-CM | POA: Diagnosis not present

## 2014-10-11 DIAGNOSIS — N3941 Urge incontinence: Secondary | ICD-10-CM | POA: Insufficient documentation

## 2014-10-11 DIAGNOSIS — Z7982 Long term (current) use of aspirin: Secondary | ICD-10-CM | POA: Insufficient documentation

## 2014-10-11 DIAGNOSIS — R339 Retention of urine, unspecified: Secondary | ICD-10-CM | POA: Insufficient documentation

## 2014-10-11 DIAGNOSIS — E780 Pure hypercholesterolemia: Secondary | ICD-10-CM | POA: Insufficient documentation

## 2014-10-11 DIAGNOSIS — Z87442 Personal history of urinary calculi: Secondary | ICD-10-CM | POA: Insufficient documentation

## 2014-10-11 DIAGNOSIS — Z87440 Personal history of urinary (tract) infections: Secondary | ICD-10-CM | POA: Insufficient documentation

## 2014-10-11 DIAGNOSIS — E039 Hypothyroidism, unspecified: Secondary | ICD-10-CM | POA: Diagnosis not present

## 2014-10-11 DIAGNOSIS — N359 Urethral stricture, unspecified: Secondary | ICD-10-CM | POA: Diagnosis not present

## 2014-10-11 HISTORY — DX: Presence of spectacles and contact lenses: Z97.3

## 2014-10-11 HISTORY — DX: Other constipation: K59.09

## 2014-10-11 HISTORY — PX: CYSTO: SHX6284

## 2014-10-11 HISTORY — DX: Retention of urine, unspecified: R33.9

## 2014-10-11 HISTORY — DX: Unilateral primary osteoarthritis, unspecified knee: M17.10

## 2014-10-11 HISTORY — DX: Osteoarthritis of knee, unspecified: M17.9

## 2014-10-11 HISTORY — DX: Presence of external hearing-aid: Z97.4

## 2014-10-11 HISTORY — DX: Unspecified urinary incontinence: R32

## 2014-10-11 HISTORY — DX: Gastro-esophageal reflux disease without esophagitis: K21.9

## 2014-10-11 LAB — POCT HEMOGLOBIN-HEMACUE: Hemoglobin: 13 g/dL (ref 13.0–17.0)

## 2014-10-11 SURGERY — CYSTO
Anesthesia: General | Site: Bladder

## 2014-10-11 MED ORDER — ONDANSETRON HCL 4 MG/2ML IJ SOLN
INTRAMUSCULAR | Status: DC | PRN
  Administered 2014-10-11: 4 mg via INTRAVENOUS

## 2014-10-11 MED ORDER — FENTANYL CITRATE 0.05 MG/ML IJ SOLN
INTRAMUSCULAR | Status: AC
  Filled 2014-10-11: qty 2

## 2014-10-11 MED ORDER — FENTANYL CITRATE 0.05 MG/ML IJ SOLN
INTRAMUSCULAR | Status: DC | PRN
  Administered 2014-10-11 (×5): 25 ug via INTRAVENOUS

## 2014-10-11 MED ORDER — SODIUM CHLORIDE 0.9 % IV SOLN
INTRAVENOUS | Status: DC
  Administered 2014-10-11: 10:00:00 via INTRAVENOUS
  Filled 2014-10-11: qty 1000

## 2014-10-11 MED ORDER — STERILE WATER FOR IRRIGATION IR SOLN
Status: DC | PRN
  Administered 2014-10-11: 3000 mL

## 2014-10-11 MED ORDER — BUPIVACAINE HCL (PF) 0.5 % IJ SOLN
INTRAMUSCULAR | Status: DC | PRN
  Administered 2014-10-11: 17 mL

## 2014-10-11 MED ORDER — FENTANYL CITRATE 0.05 MG/ML IJ SOLN
INTRAMUSCULAR | Status: AC
  Filled 2014-10-11: qty 4

## 2014-10-11 MED ORDER — CIPROFLOXACIN IN D5W 400 MG/200ML IV SOLN
INTRAVENOUS | Status: AC
  Filled 2014-10-11: qty 200

## 2014-10-11 MED ORDER — CIPROFLOXACIN IN D5W 400 MG/200ML IV SOLN
400.0000 mg | INTRAVENOUS | Status: AC
  Administered 2014-10-11: 400 mg via INTRAVENOUS
  Filled 2014-10-11: qty 200

## 2014-10-11 MED ORDER — LIDOCAINE HCL (CARDIAC) 20 MG/ML IV SOLN
INTRAVENOUS | Status: DC | PRN
  Administered 2014-10-11: 50 mg via INTRAVENOUS

## 2014-10-11 MED ORDER — IOTHALAMATE MEGLUMINE 17.2 % UR SOLN
URETHRAL | Status: DC | PRN
  Administered 2014-10-11: 100 mL via URETHRAL

## 2014-10-11 MED ORDER — IOHEXOL 350 MG/ML SOLN
INTRAVENOUS | Status: DC | PRN
  Administered 2014-10-11: 50 mL via INTRAVENOUS

## 2014-10-11 MED ORDER — FENTANYL CITRATE 0.05 MG/ML IJ SOLN
25.0000 ug | INTRAMUSCULAR | Status: DC | PRN
  Administered 2014-10-11 (×2): 25 ug via INTRAVENOUS
  Filled 2014-10-11: qty 1

## 2014-10-11 MED ORDER — ACETAMINOPHEN 10 MG/ML IV SOLN
INTRAVENOUS | Status: DC | PRN
  Administered 2014-10-11: 1000 mg via INTRAVENOUS

## 2014-10-11 MED ORDER — PROPOFOL 10 MG/ML IV BOLUS
INTRAVENOUS | Status: DC | PRN
  Administered 2014-10-11: 120 mg via INTRAVENOUS
  Administered 2014-10-11: 30 mg via INTRAVENOUS

## 2014-10-11 MED ORDER — DEXAMETHASONE SODIUM PHOSPHATE 4 MG/ML IJ SOLN
INTRAMUSCULAR | Status: DC | PRN
Start: 2014-10-11 — End: 2014-10-11
  Administered 2014-10-11: 4 mg via INTRAVENOUS

## 2014-10-11 MED ORDER — ONDANSETRON HCL 4 MG/2ML IJ SOLN
4.0000 mg | Freq: Once | INTRAMUSCULAR | Status: DC | PRN
  Filled 2014-10-11: qty 2

## 2014-10-11 MED ORDER — DEXTROSE-NACL 5-0.9 % IV SOLN
INTRAVENOUS | Status: DC
  Filled 2014-10-11: qty 1000

## 2014-10-11 SURGICAL SUPPLY — 39 items
BAG DRAIN URO-CYSTO SKYTR STRL (DRAIN) ×3 IMPLANT
BAG DRN UROCATH (DRAIN) ×1
BAG URINE DRAINAGE (UROLOGICAL SUPPLIES) ×2 IMPLANT
BALLN NEPHROSTOMY (BALLOONS) ×3
BALLOON NEPHROSTOMY (BALLOONS) IMPLANT
BLADE SURG 15 STRL LF DISP TIS (BLADE) IMPLANT
BLADE SURG 15 STRL SS (BLADE) ×3
CANISTER SUCT LVC 12 LTR MEDI- (MISCELLANEOUS) ×2 IMPLANT
CATH FOLEY 2W COUNCIL 5CC 16FR (CATHETERS) ×2 IMPLANT
CATH FOLEY 2WAY SLVR  5CC 14FR (CATHETERS) ×2
CATH FOLEY 2WAY SLVR  5CC 16FR (CATHETERS) ×2
CATH FOLEY 2WAY SLVR 5CC 14FR (CATHETERS) IMPLANT
CATH FOLEY 2WAY SLVR 5CC 16FR (CATHETERS) IMPLANT
CATH FOLEY INTRO SUPRA 16F (CATHETERS) ×4 IMPLANT
CATH INTERMIT  6FR 70CM (CATHETERS) ×2 IMPLANT
CLOTH BEACON ORANGE TIMEOUT ST (SAFETY) ×3 IMPLANT
DRSG TEGADERM 4X4.75 (GAUZE/BANDAGES/DRESSINGS) ×2 IMPLANT
ELECT REM PT RETURN 9FT ADLT (ELECTROSURGICAL) ×3
ELECTRODE REM PT RTRN 9FT ADLT (ELECTROSURGICAL) ×1 IMPLANT
GLOVE BIO SURGEON STRL SZ 6.5 (GLOVE) ×1 IMPLANT
GLOVE BIO SURGEON STRL SZ7.5 (GLOVE) ×3 IMPLANT
GLOVE BIO SURGEONS STRL SZ 6.5 (GLOVE) ×1
GLOVE BIOGEL PI IND STRL 6.5 (GLOVE) IMPLANT
GLOVE BIOGEL PI INDICATOR 6.5 (GLOVE) ×2
GOWN STRL REIN XL XLG (GOWN DISPOSABLE) ×3 IMPLANT
GOWN STRL REUS W/ TWL XL LVL3 (GOWN DISPOSABLE) IMPLANT
GOWN STRL REUS W/TWL XL LVL3 (GOWN DISPOSABLE) ×6
GUIDEWIRE STR DUAL SENSOR (WIRE) ×2 IMPLANT
NDL SPNL 22GX3.5 QUINCKE BK (NEEDLE) IMPLANT
NEEDLE SPNL 22GX3.5 QUINCKE BK (NEEDLE) ×3 IMPLANT
PACK CYSTO (CUSTOM PROCEDURE TRAY) ×3 IMPLANT
PENCIL BUTTON HOLSTER BLD 10FT (ELECTRODE) ×2 IMPLANT
PLUG CATH AND CAP STER (CATHETERS) ×2 IMPLANT
SPONGE GAUZE 4X4 12PLY STER LF (GAUZE/BANDAGES/DRESSINGS) ×2 IMPLANT
SUT ETHILON 3 0 FSL (SUTURE) ×2 IMPLANT
SYR CONTROL 10ML LL (SYRINGE) ×4 IMPLANT
SYRINGE 10CC LL (SYRINGE) ×2 IMPLANT
WATER STERILE IRR 3000ML UROMA (IV SOLUTION) ×3 IMPLANT
WATER STERILE IRR 500ML POUR (IV SOLUTION) ×2 IMPLANT

## 2014-10-11 NOTE — Anesthesia Preprocedure Evaluation (Addendum)
Anesthesia Evaluation  Patient identified by MRN, date of birth, ID band Patient awake    Reviewed: Allergy & Precautions, NPO status , Patient's Chart, lab work & pertinent test results  History of Anesthesia Complications Negative for: history of anesthetic complications  Airway Mallampati: II  TM Distance: >3 FB Neck ROM: Full    Dental no notable dental hx. (+) Dental Advisory Given   Pulmonary neg pulmonary ROS,  breath sounds clear to auscultation  Pulmonary exam normal       Cardiovascular negative cardio ROS  Rhythm:Regular Rate:Normal     Neuro/Psych negative neurological ROS  negative psych ROS   GI/Hepatic Neg liver ROS, GERD-  Medicated and Controlled,  Endo/Other  Hypothyroidism   Renal/GU negative Renal ROS  negative genitourinary   Musculoskeletal negative musculoskeletal ROS (+)   Abdominal   Peds negative pediatric ROS (+)  Hematology negative hematology ROS (+)   Anesthesia Other Findings Hx of vocal cord surgery  Reproductive/Obstetrics negative OB ROS                            Anesthesia Physical Anesthesia Plan  ASA: II  Anesthesia Plan: General   Post-op Pain Management:    Induction: Intravenous  Airway Management Planned: LMA  Additional Equipment:   Intra-op Plan:   Post-operative Plan: Extubation in OR  Informed Consent: I have reviewed the patients History and Physical, chart, labs and discussed the procedure including the risks, benefits and alternatives for the proposed anesthesia with the patient or authorized representative who has indicated his/her understanding and acceptance.   Dental advisory given  Plan Discussed with: CRNA  Anesthesia Plan Comments:        Anesthesia Quick Evaluation

## 2014-10-11 NOTE — Anesthesia Procedure Notes (Signed)
**Note De-Carter via Obfuscation** Procedure Name: LMA Insertion Date/Time: 10/11/2014 10:21 AM Performed by: Norva PavlovALLAWAY, [name], Luke Carter, Luke Carter: bite block Placement Confirmation: positive ETCO2 Tube secured with: Tape Dental Injury: Teeth and Oropharynx as per pre-operative assessment

## 2014-10-11 NOTE — Interval H&P Note (Signed)
History and Physical Interval Note:  10/11/2014 10:16 AM  Luke Carter  has presented today for surgery, with the diagnosis of retention  The various methods of treatment have been discussed with the patient and family. After consideration of risks, benefits and other options for treatment, the patient has consented to  Procedure(s) with comments: CYSTO (N/A) - cysto  and foley  plus or minus supra pubic tube placement (Rusch)  as a surgical intervention .  The patient's history has been reviewed, patient examined, no change in status, stable for surgery.  I have reviewed the patient's chart and labs.  Questions were answered to the patient's satisfaction.     [name]

## 2014-10-11 NOTE — Anesthesia Postprocedure Evaluation (Signed)
  Anesthesia Post-op Note  Patient: Luke Carter  Procedure(s) Performed: Procedure(s) (LRB): CYSTOSCOPY, URETHERAL DILATION, PLACEMENT OF SUPRAPUBIC TUBE (N/A)  Patient Location: PACU  Anesthesia Type: General  Level of Consciousness: awake and alert   Airway and Oxygen Therapy: Patient Spontanous Breathing  Post-op Pain: mild  Post-op Assessment: Post-op Vital signs reviewed, Patient's Cardiovascular Status Stable, Respiratory Function Stable, Patent Airway and No signs of Nausea or vomiting  Last Vitals:  Filed Vitals:   10/11/14 1200  BP: 148/59  Pulse: 63  Temp:   Resp: 13    Post-op Vital Signs: stable   Complications: No apparent anesthesia complications

## 2014-10-11 NOTE — Discharge Instructions (Addendum)
Cystoscopy patient instructions  Following a cystoscopy, a catheter (a flexible rubber tube) is sometimes left in place to empty the bladder. This may cause some discomfort or a feeling that you need to urinate. Your doctor determines the period of time that the catheter will be left in place. You may have bloody urine for two to three days (Call your doctor if the amount of bleeding increases or does not subside).  You may pass blood clots in your urine, especially if you had a biopsy. It is not unusual to pass small blood clots and have some bloody urine a couple of weeks after your cystoscopy. Again, call your doctor if the bleeding does not subside. You may have: Dysuria (painful urination) Frequency (urinating often) Urgency (strong desire to urinate)  These symptoms are common especially if medicine is instilled into the bladder or a ureteral stent is placed. Avoiding alcohol and caffeine, such as coffee, tea, and chocolate, may help relieve these symptoms. Drink plenty of water, unless otherwise instructed. Your doctor may also prescribe an antibiotic or other medicine to reduce these symptoms.  Cystoscopy results are available soon after the procedure; biopsy results usually take two to four days. Your doctor will discuss the results of your exam with you. Before you go home, you will be given specific instructions for follow-up care. Special Instructions:   If you are going home with a catheter in place do not take a tub bath until removed by your doctor.   You may resume your normal activities.   Do not drive or operate machinery if you are taking narcotic pain medicine.   Be sure to keep all follow-up appointments with your doctor.    Call Your Doctor If: The catheter is not draining  You have severe pain  You are unable to urinate  You have a fever over 101  You have severe bleeding         May restart aspirin in 3 days                             Post Anesthesia  Home Care Instructions  Activity: Get plenty of rest for the remainder of the day. A responsible adult should stay with you for 24 hours following the procedure.  For the next 24 hours, DO NOT: -Drive a car -Advertising copywriter -Drink alcoholic beverages -Take any medication unless instructed by your physician -Make any legal decisions or sign important papers.  Meals: Start with liquid foods such as gelatin or soup. Progress to regular foods as tolerated. Avoid greasy, spicy, heavy foods. If nausea and/or vomiting occur, drink only clear liquids until the nausea and/or vomiting subsides. Call your physician if vomiting continues.  Special Instructions/Symptoms: Your throat may feel dry or sore from the anesthesia or the breathing tube placed in your throat during surgery. If this causes discomfort, gargle with warm salt water. The discomfort should disappear within 24 hours. Indwelling Urinary Catheter Care You have been given a flexible tube (catheter) used to drain the bladder. Catheters are often used when a person has difficulty urinating due to blockage, bleeding, infection, or inability to control bladder or bowel movements (incontinence). A catheter requires daily care to prevent infection and blockage. HOME CARE INSTRUCTIONS  Do the following to reduce the risk of infection. Antibiotic medicines cannot prevent infections. Limit the number of bacteria entering your bladder  Wash your hands for 2 minutes with soapy water before and  after handling the catheter.  Wash your bottom and the entire catheter twice daily, as well as after each bowel movement. Wash the tip of the penis or just above the vaginal opening with soap and warm water, rinse, and then wash the rectal area. Always wash from front to back.  When changing from the leg bag to overnight bag or from the overnight bag to leg bag, thoroughly clean the end of the catheter where it connects to the tubing with an alcohol  wipe.  Clean the leg bag and overnight bag daily after use. Replace your drainage bags weekly.  Always keep the tubing and bag below the level of your bladder. This allows your urine to drain properly. Lifting the bag or tubing above the level of your bladder will cause dirty urine to flow back into your bladder. If you must briefly lift the bag higher than your bladder, pinch the catheter or tubing to prevent backflow.  Drink enough water and fluids to keep your urine clear or pale yellow, or as directed by your caregiver. This will flush bacteria out of the bladder. Protect tissues from injury  Attach the catheter to your leg so there is no tension on the catheter. Use adhesive tape or a leg strap. If you are using adhesive tape, remove any sticky residue left behind by the previous tape you used.  Place your leg bag on your lower leg. Fasten the straps securely and comfortably.  Do not remove the catheter yourself unless you have been instructed how to do so. Keep the urinary pathway open  Check throughout the day to be sure your catheter is working and urine is draining freely. Make sure the tubing does not become kinked.  Do not let the drainage bag overfill. SEEK IMMEDIATE MEDICAL CARE IF:   The catheter becomes blocked. Urine is not draining.  Urine is leaking.  You have any pain.  You have a fever. Document Released: 07/13/2005 Document Revised: 06/29/2012 Document Reviewed: 12/12/2009 [facility] Patient Information 2015 [place], [place]. This information is not intended to replace advice given to you by your health care provider. Make sure you discuss any questions you have with your health care provider.

## 2014-10-11 NOTE — Op Note (Signed)
Preoperative diagnosis: urinary retention, inability to place foley catheter, urethral stricture/trauma Postoperative diagnosis:same Procedure:cystoscopy, balloon dilation of urethra, placement of suprapubic tube   Surgeon: Valetta Fulleravid S. Jemima Petko M.D.  Anesthesia: Gen.  Indications: Patient was seen this morning in our office by Dr. Sherron MondayMacDiarmid for recurrent urinary retention. Attempts at Foley catheter placement were unsuccessful and cystoscopy failed to reveal an obvious lumen in order to direct a guidewire. For that reason it was suggested that the patient be taken down to surgery for additional attempts at placing a urethral catheter and/or a suprapubic tube. Dr. Sherron MondayMacDiarmid asked me to be involved in his case since he was not going to be available clinically. We discussed the situation and then I did discuss things further with Mr. Alona BeneJoyce and his family. We will try to place both a urethral and suprapubic catheter to give additional options.     Technique and findings: Patient was brought the operating room where he had successful induction of general anesthesia. He was placed in lithotomy position and prepped and draped in usual manner. He received perioperative antibiotics. Appropriate surgical timeout was performed. Cystoscopy was performed initially. There appeared to be evidence of urethral trauma with false passage near the bulbar urethra/membranous urethra. There was some mild active oozing. I could not definitively see evidence of a obvious lumen. I therefore utilized a spinal needle several fingerbreadths above the pubic symphysis. We were able to aspirate yellow urine. A small poke incision was made in that location and a trocar suprapubic introducer was placed. A gush of urine was obtained. Unfortunately I was unable to thread the catheter through the insertion port. I had injected some contrast and there appeared to be a small amount of extraperitoneal extravasation on the left side of the bladder.    We reinserted the cystoscope and with additional assessment were able to find a small area that looked promising for a lumen. A guidewire was placed in the bladder. Over the guidewire I placed an open-ended catheter was able to inject some contrast confirming that we were indeed in the bladder. A fascial dilating balloon was utilized to dilate the urethra to 24 [place]. Over the guidewire I was then able to place a catheter without difficulty. I removed the catheter and performed repeat cystoscopy. It appeared that the initial attempt at placement of the suprapubic tube resulted in some skiving of the lateral aspect of the bladder. There did not appear to be obvious bladder injury. With this spinal needle I was able to direct an ideal location anteriorly near the dome of the bladder. With direct visual guidance I was then able to reinduce the trocar and place a 14 French suprapubic tube. That suprapubic tube was secured to the skin with a nylon suture. We then replaced the urethral catheter over the wire utilizing a 16 [place] consultative Foley. The patient was then brought to PACU with successful placement of both a suprapubic tube and a urethral catheter.

## 2014-10-11 NOTE — H&P (Signed)
Reason for visit:  Luke Carter is to undergo urgent cystoscopy with placement of a Foley catheter or suprapubic tube for urinary retention.  He was in our office morning unable to void.  A Foley catheter was not able to be inserted.  The rest of his history is summarized in the below:     History of Present Illness   Mr Luke Carter came in again today in retention. In the past he had cool thermotherapy in April 2013 by Dr Annabell Howells. He was not urodynamically obstructed back in 2011. He actually had a little bit of trouble emptying his bladder after a Botox treatment and has failed multiple therapies for urge incontinence and frequency.   He has had Foley catheters up to 800 mL.   Review of Systems: No change in bowel or neurologic systems.   Urinalysis: Positive bacteria.   Urine culture from September 25, 2014 was negative.       We tried to insert a Coude catheter unsuccessfully. Mr Hashemi was having some discomfort. He was obviously was over distended.  I was called in and immediately used the cystoscope. He had significant obstruction at the membranous urethra. It looked like he had a false passage at the floor, but especially the right side of the urethra. I was surprised that I could not scope the lumen. For approximately 20 minutes or so, I gently tried to pass the sensor wire through different potential openings, but was not able to.  The distal urethral was normal.  Physical exam  Elderly male in moderate distress  Respiratory: Normal effort Cardiac: Regular rate and rhythm Abdomen: Soft with bladder distention and moderate suprapubic tenderness Genitourinary: Blood at meatus Extremities: No edema   Past Medical History Problems   1. History of Arthritis  2. History of Calculus of distal right ureter (N20.1)  3. History of Cholelithiasis, unspecified acuity, unspecified acuity  4. History of diabetes mellitus (Z86.39)  5. History of esophageal reflux (Z87.19)  6. History of  hypercholesterolemia (Z86.39)  7. History of kidney stones (Z87.442)  8. History of Urinary Tract Infection  Surgical History Problems   1. History of Laryngeal Surgery  Current Meds  1. Adult Aspirin Low Strength 81 MG TBDP;  Therapy: (Recorded:27Feb2008) to Recorded  2. Caltrate 600+D TABS;  Therapy: (Recorded:27Feb2008) to Recorded  3. Carac 0.5 % External Cream;  Therapy: 28Aug2014 to Recorded  4. Daily Multiple Vitamins TABS;  Therapy: (Recorded:27Feb2008) to Recorded  5. Finasteride TABS;  Therapy: (Recorded:13Jun2014) to Recorded  6. Fish Oil CAPS;  Therapy: (Recorded:13Jun2014) to Recorded  7. Levothyroxine Sodium 50 MCG Oral Tablet;  Therapy: 24Jul2014 to Recorded  8. Omeprazole 20 MG Oral Capsule Delayed Release;  Therapy: (Recorded:27Feb2008) to Recorded  9. Pravastatin Sodium TABS;  Therapy: (Recorded:13Jun2014) to Recorded  10. Terazosin HCl 2 MG TABS; TAKE 2 TABLET Daily;   Therapy: (Recorded:12Dec2013) to Recorded  11. Vitamin B-12 SOLN;   Therapy: (Recorded:21Aug2013) to Recorded  Allergies Medication   1. No Known Drug Allergies  Family History Problems   1. Family history of Death In The Family Father : Father   Heart     Age 23  2. Family history of Death In The Family Mother : Mother  3. Family history of Family Health Status Number Of Children   1 daughter  Social History Problems   1. Denied: Alcohol Use  2. Caffeine Use   2 per day  3. Marital History - Currently Married  4. Never A Smoker  5. Occupation:  retired  Paediatric nurseesults/Data Urine [Data Includes: Last 1 Day]   16Mar2016 COLOR YELLOW  APPEARANCE CLOUDY  SPECIFIC GRAVITY <1.005  pH 5.0  GLUCOSE NEG mg/dL BILIRUBIN NEG  KETONE NEG mg/dL BLOOD LARGE  PROTEIN TRACE mg/dL UROBILINOGEN 0.2 mg/dL NITRITE NEG  LEUKOCYTE ESTERASE LARGE  SQUAMOUS EPITHELIAL/HPF RARE  WBC 21-50 WBC/hpf RBC 0-2 RBC/hpf BACTERIA MANY  CRYSTALS NONE SEEN  CASTS NONE SEEN    Assessment Assessed   1. Incomplete bladder emptying (R33.9)  2. Urge incontinence of urine (N39.41)  Discussion/Summary   Mr Luke Carter and I spoke with his family as well. I recommended cystoscopy under anesthesia and passage of Foley catheter and dilation. Otherwise, Dr Luke Carter will insert a suprapubic catheter. Pros, cons, and risks discussed. Even bowel injury with sequelae discussed. I will ask Dr Luke Carter to consider a Rusch catheter with a 14-16 [place] Foley, because this may actually be a potential long term therapy for him.   I am concerned that the obstruction is at the level of the membranous urethra. He had low-pressure voiding in the past and a TURP may not help him and certainly with a scar, etc, the membranous urethra may also be problematic.    He has not eaten since last night. With a sip, I gave him one Urogesic Blue hoping it will help change the color of his urine to help Dr Luke Carter find the lumen.   Addendum:  Plan discussed with patient and family. He will most likely end up with a suprapubic tube. This is likely to be a better long-term strategy. Given his previous problems and now what is likely to be significant issues with urethral stricture disease suprapubic tube may ultimately be the best management strategy going forward.

## 2014-10-11 NOTE — Transfer of Care (Signed)
   Last Vitals:  Filed Vitals:   10/11/14 0940  BP: 172/85  Pulse: 91  Temp: 36.4 C  Resp: 16    Immediate Anesthesia Transfer of Care Note  Patient: Luke Carter  Procedure(s) Performed: Procedure(s) (LRB): CYSTOSCOPY, URETHERAL DILATION, PLACEMENT OF SUPRAPUBIC TUBE (N/A)  Patient Location: PACU  Anesthesia Type: General  Level of Consciousness: awake, alert  and oriented  Airway & Oxygen Therapy: Patient Spontanous Breathing and Patient connected to face mask oxygen  Post-op Assessment: Report given to PACU RN and Post -op Vital signs reviewed and stable  Post vital signs: Reviewed and stable  Complications: No apparent anesthesia complications

## 2014-10-12 ENCOUNTER — Encounter (HOSPITAL_BASED_OUTPATIENT_CLINIC_OR_DEPARTMENT_OTHER): Payer: Self-pay | Admitting: Urology

## 2014-11-20 ENCOUNTER — Telehealth: Payer: Self-pay | Admitting: Family Medicine

## 2014-11-20 NOTE — Telephone Encounter (Signed)
Stp and he was [facility] Lab results printed and left up front for the pt to pick up and pt will get them in the morning.

## 2014-11-23 ENCOUNTER — Encounter: Payer: Self-pay | Admitting: Family Medicine

## 2014-11-23 ENCOUNTER — Ambulatory Visit (INDEPENDENT_AMBULATORY_CARE_PROVIDER_SITE_OTHER): Payer: Medicare Other | Admitting: Family Medicine

## 2014-11-23 VITALS — BP 119/73 | HR 73 | Temp 97.2°F | Ht 68.5 in | Wt 154.0 lb

## 2014-11-23 DIAGNOSIS — E559 Vitamin D deficiency, unspecified: Secondary | ICD-10-CM

## 2014-11-23 DIAGNOSIS — E538 Deficiency of other specified B group vitamins: Secondary | ICD-10-CM

## 2014-11-23 DIAGNOSIS — Z9359 Other cystostomy status: Secondary | ICD-10-CM | POA: Diagnosis not present

## 2014-11-23 DIAGNOSIS — R32 Unspecified urinary incontinence: Secondary | ICD-10-CM | POA: Diagnosis not present

## 2014-11-23 DIAGNOSIS — N400 Enlarged prostate without lower urinary tract symptoms: Secondary | ICD-10-CM

## 2014-11-23 DIAGNOSIS — R5383 Other fatigue: Secondary | ICD-10-CM | POA: Diagnosis not present

## 2014-11-23 DIAGNOSIS — E039 Hypothyroidism, unspecified: Secondary | ICD-10-CM | POA: Diagnosis not present

## 2014-11-23 DIAGNOSIS — E785 Hyperlipidemia, unspecified: Secondary | ICD-10-CM | POA: Diagnosis not present

## 2014-11-23 DIAGNOSIS — R531 Weakness: Secondary | ICD-10-CM | POA: Diagnosis not present

## 2014-11-23 DIAGNOSIS — R2681 Unsteadiness on feet: Secondary | ICD-10-CM

## 2014-11-23 LAB — POCT CBC
Granulocyte percent: 59.7 %G (ref 37–80)
HCT, POC: 38.3 % — AB (ref 43.5–53.7)
Hemoglobin: 11.7 g/dL — AB (ref 14.1–18.1)
Lymph, poc: 1.9 (ref 0.6–3.4)
MCH: 28 pg (ref 27–31.2)
MCHC: 30.5 g/dL — AB (ref 31.8–35.4)
MCV: 92 fL (ref 80–97)
MPV: 7.2 fL (ref 0–99.8)
PLATELET COUNT, POC: 225 10*3/uL (ref 142–424)
POC GRANULOCYTE: 3.5 (ref 2–6.9)
POC LYMPH PERCENT: 33.1 %L (ref 10–50)
RBC: 4.17 M/uL — AB (ref 4.69–6.13)
RDW, POC: 14.1 %
WBC: 5.8 10*3/uL (ref 4.6–10.2)

## 2014-11-23 MED ORDER — CYANOCOBALAMIN 1000 MCG/ML IJ SOLN
1000.0000 ug | INTRAMUSCULAR | Status: DC
  Administered 2014-11-23 – 2015-04-17 (×5): 1000 ug via INTRAMUSCULAR

## 2014-11-23 NOTE — Addendum Note (Signed)
Addended by: Magdalene RiverBULLINS, Christean Silvestri H on: 11/23/2014 02:40 PM   Modules accepted: Orders, Medications

## 2014-11-23 NOTE — Progress Notes (Signed)
Subjective:    Patient ID: Luke Carter, male    DOB: 1921-03-20, 81 y.o.   MRN: [id number]  HPI Pt here for follow up and management of chronic medical problems which includes hypothyroid and hyperlipidemia. He is taking medications regularly. It is of significance today that this patient has had BPH, urinary retention secondary to BPH and multiple problems associated with this. His had an indwelling catheter and now has a suprapubic catheter. He has made multiple trips back and forth to [facility] over the past several months in order to get this problem situated. He was offered surgery but the patient declined this and the urologist agreed that that was probably the most or the best way to go and just keep the permanent suprapubic catheter in place. He is still having to make visits back to [facility] to have this monitored and hopefully down the road a home health nurse can come in and change this for him as often as it needs to be changed. All of this as we can this patient considerably. He is 81 years old and he is extremely weak and needs rehabilitation and order to get his strength back. He says he is depressed and weak.      Patient Active Problem List   Diagnosis Date Noted  . Urinary incontinence 07/13/2013  . Vitamin B12 deficiency 02/15/2013  . Hyperlipemia 02/15/2013  . GERD (gastroesophageal reflux disease) 02/15/2013  . Hypothyroid 02/15/2013  . BPH (benign prostatic hypertrophy) 02/15/2013   Outpatient Encounter Prescriptions as of 11/23/2014  Medication Sig  . Calcium Carbonate-Vit D-Min 1200-1000 MG-UNIT CHEW Chew 1 tablet by mouth daily.  . Cyanocobalamin (B-12 COMPLIANCE INJECTION IJ) Inject as directed every 30 (thirty) days. Last injection [phone] approx  . finasteride (PROSCAR) 5 MG tablet Take 5 mg by mouth daily.  . fish oil-omega-3 fatty acids 1000 MG capsule Take 2 g by mouth daily.  Marland Kitchen levothyroxine (SYNTHROID, LEVOTHROID) 50 MCG tablet Take 50 mcg. Mon,  Wed, and Friday. 75 mcg all other days  . Multiple Vitamin (MULTIVITAMIN) tablet Take 1 tablet by mouth daily.  Marland Kitchen omeprazole (PRILOSEC) 20 MG capsule Take 20 mg by mouth 2 (two) times daily.  . polyethylene glycol (MIRALAX / GLYCOLAX) packet Take 17 g by mouth daily as needed for moderate constipation or severe constipation.  . pravastatin (PRAVACHOL) 40 MG tablet Take 40 mg by mouth daily.  . [DISCONTINUED] bisacodyl (DULCOLAX) 5 MG EC tablet Take 10 mg by mouth daily as needed for moderate constipation or severe constipation.  . [DISCONTINUED] silodosin (RAPAFLO) 8 MG CAPS capsule Take 8 mg by mouth every evening.     Review of Systems  Constitutional: Negative.   HENT: Negative.   Eyes: Negative.   Respiratory: Negative.   Cardiovascular: Negative.   Gastrointestinal: Negative.   Endocrine: Negative.   Genitourinary: Negative.   Musculoskeletal: Negative.   Skin: Negative.   Allergic/Immunologic: Negative.   Neurological: Negative.   Hematological: Negative.   Psychiatric/Behavioral: Negative.        Objective:   Physical Exam  Constitutional: He is oriented to person, place, and time. No distress.  The patient is alert but somewhat thin and frail from his past several months of experiences of visiting the urologist sometimes as often as twice daily and driving 440 miles.  HENT:  Head: Normocephalic and atraumatic.  Right Ear: External ear normal.  Left Ear: External ear normal.  Nose: Nose normal.  Mouth/Throat: Oropharynx is clear and moist. No oropharyngeal exudate.  The patient wears bilateral hearing aids.  Eyes: Conjunctivae and EOM are normal. Pupils are equal, round, and reactive to light. Right eye exhibits no discharge. Left eye exhibits no discharge. No scleral icterus.  Neck: Normal range of motion. Neck supple. No thyromegaly present.  Cardiovascular: Normal rate, regular rhythm, normal heart sounds and intact distal pulses.   No murmur heard. At 72/m    Pulmonary/Chest: Effort normal and breath sounds normal. No respiratory distress. He has no wheezes. He has no rales. He exhibits no tenderness.  Abdominal: Soft. Bowel sounds are normal. He exhibits no mass. There is no tenderness. There is no rebound and no guarding.  The patient has a suprapubic catheter in place and there is no redness or drainage present today.  Musculoskeletal: Normal range of motion. He exhibits no edema or tenderness.  The gait is somewhat weakened and slowed compared to his normal gait. He is more hesitant.  Lymphadenopathy:    He has no cervical adenopathy.  Neurological: He is alert and oriented to person, place, and time.  Skin: Skin is warm and dry. No rash noted. No erythema. There is pallor.  Psychiatric: His behavior is normal. Judgment and thought content normal.  Mood and affect are somewhat depressed.  Nursing note and vitals reviewed.  BP 119/73 mmHg  Pulse 73  Temp(Src) 97.2 F (36.2 C) (Oral)  Ht 5' 8.5" (1.74 m)  Wt 154 lb (69.854 kg)  BMI 23.07 kg/m2        Assessment & Plan:  1. BPH (benign prostatic hyperplasia) -The patient will continue with follow-up with his urologist - POCT CBC  2. Vitamin B 12 deficiency -He will get a B12 injection today - POCT CBC  3. Hypothyroidism, unspecified hypothyroidism type -We will check lab work and make sure his thyroid is adequately controlled - POCT CBC - Thyroid Panel With TSH  4. Hyperlipemia -He should continue current treatment pending results of lab work - POCT CBC - BMP8+EGFR - Hepatic function panel - Lipid panel  5. Urinary incontinence, unspecified incontinence type -The patient is followed by the urologist because of the recent insertion of a suprapubic catheter. - POCT CBC - Home Health - Face-to-face encounter (required for Medicare/Medicaid patients)  6. Vitamin D deficiency -He will continue current treatment pending results of lab work - POCT CBC - Vit D  25 hydroxy  (rtn osteoporosis monitoring)  7. Suprapubic catheter -Continue follow-up with urology - Home Health - Face-to-face encounter (required for Medicare/Medicaid patients)  8. Gait instability -We will arrange for home health to work with him to help get his strength back following these multiple urologic procedures - Home Health - Face-to-face encounter (required for Medicare/Medicaid patients)  9. Weakness -We will check lab work and arrange for home health to help him recover - Home Health - Face-to-face encounter (required for Medicare/Medicaid patients)  10. Other fatigue -Lab work is pending Sports administrator - Face-to-face encounter (required for Medicare/Medicaid patients)  Patient Instructions                       Medicare Annual Wellness Visit  [facility] and the medical providers at [facility] strive to bring you the best medical care.  In doing so we not only want to address your current medical conditions and concerns but also to detect new conditions early and prevent illness, disease and health-related problems.    Medicare offers a yearly Wellness Visit which allows our clinical  staff to assess your need for preventative services including immunizations, lifestyle education, counseling to decrease risk of preventable diseases and screening for fall risk and other medical concerns.    This visit is provided free of charge (no copay) for all Medicare recipients. The clinical pharmacists at [facility] have begun to conduct these Wellness Visits which will also include a thorough review of all your medications.    As you primary medical provider recommend that you make an appointment for your Annual Wellness Visit if you have not done so already this year.  You may set up this appointment before you leave today or you may call back ([phone]) and schedule an appointment.  Please make sure when you call that you mention that you are  scheduling your Annual Wellness Visit with the clinical pharmacist so that the appointment may be made for the proper length of time.     Continue current medications. Continue good therapeutic lifestyle changes which include good diet and exercise. Fall precautions discussed with patient. If an FOBT was given today- please return it to our front desk. If you are over 93 years old - you may need Prevnar 49 or the adult Pneumonia vaccine.  Flu Shots are still available at our office. If you still haven't had one please call to set up a nurse visit to get one.   After your visit with [place] today you will receive a survey in the mail or online from [employer] regarding your care with [place]. Please take a moment to fill this out. Your feedback is very important to [place] as you can help [place] better understand your patient needs as well as improve your experience and satisfaction. WE CARE ABOUT YOU!!!   This patient will need home physical therapy to help rehabilitate him from the past several months of urologic procedures and complications that he has had which is ultimately led to him having a permanent suprapubic catheter. He needs gait strengthening and more extremity strengthening. We will work on arranging for home health to come in and do this as he needs to be close to his wife who can't see or hear and needs assistance also.   Arrie Senate MD

## 2014-11-23 NOTE — Addendum Note (Signed)
Addended by: Magdalene RiverBULLINS, Roniya Tetro H on: 11/23/2014 10:31 AM   Modules accepted: Orders

## 2014-11-23 NOTE — Patient Instructions (Addendum)
Medicare Annual Wellness Visit  [facility] and the medical providers at [facility] strive to bring you the best medical care.  In doing so we not only want to address your current medical conditions and concerns but also to detect new conditions early and prevent illness, disease and health-related problems.    Medicare offers a yearly Wellness Visit which allows our clinical staff to assess your need for preventative services including immunizations, lifestyle education, counseling to decrease risk of preventable diseases and screening for fall risk and other medical concerns.    This visit is provided free of charge (no copay) for all Medicare recipients. The clinical pharmacists at [facility] have begun to conduct these Wellness Visits which will also include a thorough review of all your medications.    As you primary medical provider recommend that you make an appointment for your Annual Wellness Visit if you have not done so already this year.  You may set up this appointment before you leave today or you may call back [phone] and schedule an appointment.  Please make sure when you call that you mention that you are scheduling your Annual Wellness Visit with the clinical pharmacist so that the appointment may be made for the proper length of time.     Continue current medications. Continue good therapeutic lifestyle changes which include good diet and exercise. Fall precautions discussed with patient. If an FOBT was given today- please return it to our front desk. If you are over 79 years old - you may need Prevnar 13 or the adult Pneumonia vaccine.  Flu Shots are still available at our office. If you still haven't had one please call to set up a nurse visit to get one.   After your visit with us today you will receive a survey in the mail or online from [employer] regarding your care with us. Please take a moment to  fill this out. Your feedback is very important to us as you can help us better understand your patient needs as well as improve your experience and satisfaction. WE CARE ABOUT YOU!!!   This patient will need home physical therapy to help rehabilitate him from the past several months of urologic procedures and complications that he has had which is ultimately led to him having a permanent suprapubic catheter. He needs gait strengthening and more extremity strengthening. We will work on arranging for home health to come in and do this as he needs to be close to his wife who can't see or hear and needs assistance also.

## 2014-11-24 LAB — LIPID PANEL
Chol/HDL Ratio: 3.6 ratio units (ref 0.0–5.0)
Cholesterol, Total: 136 mg/dL (ref 100–199)
HDL: 38 mg/dL — ABNORMAL LOW (ref >39)
LDL CALC: 76 mg/dL (ref 0–99)
Triglycerides: 111 mg/dL (ref 0–149)
VLDL CHOLESTEROL CAL: 22 mg/dL (ref 5–40)

## 2014-11-24 LAB — HEPATIC FUNCTION PANEL
ALBUMIN: 4 g/dL (ref 3.2–4.6)
ALK PHOS: 67 IU/L (ref 39–117)
ALT: 13 IU/L (ref 0–44)
AST: 15 IU/L (ref 0–40)
Bilirubin Total: 0.7 mg/dL (ref 0.0–1.2)
Bilirubin, Direct: 0.21 mg/dL (ref 0.00–0.40)
Total Protein: 6.5 g/dL (ref 6.0–8.5)

## 2014-11-24 LAB — THYROID PANEL WITH TSH
Free Thyroxine Index: 2.4 (ref 1.2–4.9)
T3 Uptake Ratio: 31 % (ref 24–39)
T4, Total: 7.6 ug/dL (ref 4.5–12.0)
TSH: 3.94 u[IU]/mL (ref 0.450–4.500)

## 2014-11-24 LAB — BMP8+EGFR
BUN/Creatinine Ratio: 22 (ref 10–22)
BUN: 16 mg/dL (ref 10–36)
CALCIUM: 9 mg/dL (ref 8.6–10.2)
CHLORIDE: 103 mmol/L (ref 97–108)
CO2: 26 mmol/L (ref 18–29)
Creatinine, Ser: 0.72 mg/dL — ABNORMAL LOW (ref 0.76–1.27)
GFR calc Af Amer: 94 mL/min/{1.73_m2} (ref >59)
GFR calc non Af Amer: 82 mL/min/{1.73_m2} (ref >59)
Glucose: 104 mg/dL — ABNORMAL HIGH (ref 65–99)
POTASSIUM: 4.2 mmol/L (ref 3.5–5.2)
SODIUM: 141 mmol/L (ref 134–144)

## 2014-11-24 LAB — VITAMIN D 25 HYDROXY (VIT D DEFICIENCY, FRACTURES): Vit D, 25-Hydroxy: 63.6 ng/mL (ref 30.0–100.0)

## 2014-12-10 ENCOUNTER — Telehealth: Payer: Self-pay | Admitting: Family Medicine

## 2014-12-25 ENCOUNTER — Ambulatory Visit: Payer: Medicare Other

## 2014-12-26 ENCOUNTER — Ambulatory Visit (INDEPENDENT_AMBULATORY_CARE_PROVIDER_SITE_OTHER): Payer: Medicare Other | Admitting: *Deleted

## 2014-12-26 DIAGNOSIS — E538 Deficiency of other specified B group vitamins: Secondary | ICD-10-CM | POA: Diagnosis not present

## 2014-12-26 NOTE — Patient Instructions (Signed)

## 2014-12-26 NOTE — Progress Notes (Signed)
Pt given B12 injection IM right deltoid and tolerated well. 

## 2015-01-24 ENCOUNTER — Ambulatory Visit (INDEPENDENT_AMBULATORY_CARE_PROVIDER_SITE_OTHER): Payer: Medicare Other | Admitting: Family Medicine

## 2015-01-24 DIAGNOSIS — E785 Hyperlipidemia, unspecified: Secondary | ICD-10-CM

## 2015-01-24 DIAGNOSIS — R269 Unspecified abnormalities of gait and mobility: Secondary | ICD-10-CM | POA: Diagnosis not present

## 2015-01-24 DIAGNOSIS — Z9359 Other cystostomy status: Secondary | ICD-10-CM | POA: Diagnosis not present

## 2015-01-24 DIAGNOSIS — E039 Hypothyroidism, unspecified: Secondary | ICD-10-CM

## 2015-01-29 ENCOUNTER — Ambulatory Visit (INDEPENDENT_AMBULATORY_CARE_PROVIDER_SITE_OTHER): Payer: Medicare Other | Admitting: *Deleted

## 2015-01-29 DIAGNOSIS — E538 Deficiency of other specified B group vitamins: Secondary | ICD-10-CM | POA: Diagnosis not present

## 2015-01-29 NOTE — Patient Instructions (Signed)

## 2015-01-29 NOTE — Progress Notes (Signed)
Pt given B12 injection IM left deltoid and tolerated well. °

## 2015-02-19 ENCOUNTER — Other Ambulatory Visit: Payer: Self-pay | Admitting: *Deleted

## 2015-03-04 ENCOUNTER — Ambulatory Visit (INDEPENDENT_AMBULATORY_CARE_PROVIDER_SITE_OTHER): Payer: Medicare Other | Admitting: *Deleted

## 2015-03-04 DIAGNOSIS — E538 Deficiency of other specified B group vitamins: Secondary | ICD-10-CM | POA: Diagnosis not present

## 2015-03-04 NOTE — Progress Notes (Signed)
Pt tolerated inj well

## 2015-03-12 ENCOUNTER — Ambulatory Visit: Payer: Medicare Other | Admitting: Family Medicine

## 2015-04-12 ENCOUNTER — Ambulatory Visit (INDEPENDENT_AMBULATORY_CARE_PROVIDER_SITE_OTHER): Payer: Medicare Other

## 2015-04-12 ENCOUNTER — Encounter: Payer: Self-pay | Admitting: Family Medicine

## 2015-04-12 ENCOUNTER — Ambulatory Visit (INDEPENDENT_AMBULATORY_CARE_PROVIDER_SITE_OTHER): Payer: Medicare Other | Admitting: Family Medicine

## 2015-04-12 VITALS — BP 101/60 | HR 66 | Temp 96.8°F | Ht 68.5 in | Wt 154.0 lb

## 2015-04-12 DIAGNOSIS — E559 Vitamin D deficiency, unspecified: Secondary | ICD-10-CM

## 2015-04-12 DIAGNOSIS — Z0000 Encounter for general adult medical examination without abnormal findings: Secondary | ICD-10-CM

## 2015-04-12 DIAGNOSIS — M545 Low back pain: Secondary | ICD-10-CM

## 2015-04-12 DIAGNOSIS — N400 Enlarged prostate without lower urinary tract symptoms: Secondary | ICD-10-CM

## 2015-04-12 DIAGNOSIS — E538 Deficiency of other specified B group vitamins: Secondary | ICD-10-CM | POA: Diagnosis not present

## 2015-04-12 DIAGNOSIS — E039 Hypothyroidism, unspecified: Secondary | ICD-10-CM

## 2015-04-12 DIAGNOSIS — R54 Age-related physical debility: Secondary | ICD-10-CM | POA: Diagnosis not present

## 2015-04-12 DIAGNOSIS — E785 Hyperlipidemia, unspecified: Secondary | ICD-10-CM | POA: Diagnosis not present

## 2015-04-12 NOTE — Progress Notes (Addendum)
Subjective:    Patient ID: Luke Carter, male    DOB: Jan 11, 1921, 81 y.o.   MRN: [id number]  HPI Pt here for follow up and management of chronic medical problems which includes hypothyroid and hyperlipidemia. He is taking medications regulary. The patient has also had multiple chronic issues with the most important one being severe BPH requiring a suprapubic catheter and complications from this. He has had multiple visits to his urologist and for a man who is 81 years old this is been very difficult on him. He comes to the visit today with his wife. They're both planning to move to an assisted/long-term care facility sometime after Christmas. This is probably a good thing. He is complaining of problems with his right thigh and right shoulder. He also complains of leg cramps. I noticed today that his blood pressure is running low and this could account for some of his weakness. We will reassess his need for Hytrin. The patient does have occasional chest pain. This is not been any more frequently than usual. He does have upper thoracic spine parathoracic pain on the right and it is tender in this area. He also has pain in his right low back and hip areas especially with arising. This is also bothersome with bending and lifting up anything. He denies chest pain or shortness of breath otherwise than mentioned above. He is having no problems with swallowing heartburn indigestion nausea vomiting or diarrhea or blood in the stool. He has a suprapubic catheter in place and this is constantly irritating to him. He sees the urologist as mentioned above regularly.       Patient Active Problem List   Diagnosis Date Noted  . Urinary incontinence 07/13/2013  . Vitamin B12 deficiency 02/15/2013  . Hyperlipemia 02/15/2013  . GERD (gastroesophageal reflux disease) 02/15/2013  . Hypothyroid 02/15/2013  . BPH (benign prostatic hypertrophy) 02/15/2013   Outpatient Encounter Prescriptions as of 04/12/2015    Medication Sig  . Calcium Carbonate-Vit D-Min 1200-1000 MG-UNIT CHEW Chew 1 tablet by mouth daily.  . Cyanocobalamin (B-12 COMPLIANCE INJECTION IJ) Inject as directed every 30 (thirty) days. Last injection [phone] approx  . finasteride (PROSCAR) 5 MG tablet Take 5 mg by mouth daily.  . fish oil-omega-3 fatty acids 1000 MG capsule Take 2 g by mouth daily.  Marland Kitchen levothyroxine (SYNTHROID, LEVOTHROID) 50 MCG tablet Take 50 mcg. Mon, Wed, and Friday. 75 mcg all other days  . Multiple Vitamin (MULTIVITAMIN) tablet Take 1 tablet by mouth daily.  Marland Kitchen omeprazole (PRILOSEC) 20 MG capsule Take 20 mg by mouth 2 (two) times daily.  . polyethylene glycol (MIRALAX / GLYCOLAX) packet Take 17 g by mouth daily as needed for moderate constipation or severe constipation.  . pravastatin (PRAVACHOL) 40 MG tablet Take 40 mg by mouth daily.  Marland Kitchen terazosin (HYTRIN) 2 MG capsule Take 2 mg by mouth 2 (two) times daily.   Facility-Administered Encounter Medications as of 04/12/2015  Medication  . cyanocobalamin ((VITAMIN B-12)) injection 1,000 mcg     Review of Systems  Constitutional: Negative.   HENT: Negative.   Eyes: Negative.   Respiratory: Negative.   Cardiovascular: Negative.   Gastrointestinal: Negative.   Endocrine: Negative.   Genitourinary: Negative.   Musculoskeletal: Positive for arthralgias (right thigh and right shoulder pain).  Skin: Negative.   Allergic/Immunologic: Negative.   Neurological: Negative.   Hematological: Negative.   Psychiatric/Behavioral: Negative.        Objective:   Physical Exam  Constitutional: He is oriented  to person, place, and time. No distress.  Elderly, somewhat frail but alert.  HENT:  Head: Normocephalic and atraumatic.  Right Ear: External ear normal.  Left Ear: External ear normal.  Nose: Nose normal.  Mouth/Throat: Oropharynx is clear and moist. No oropharyngeal exudate.  Eyes: Conjunctivae and EOM are normal. Pupils are equal, round, and reactive to  light. Right eye exhibits no discharge. Left eye exhibits no discharge. No scleral icterus.  Neck: Normal range of motion. Neck supple. No thyromegaly present.  Cardiovascular: Normal rate, regular rhythm, normal heart sounds and intact distal pulses.   No murmur heard. At 60/m  Pulmonary/Chest: Effort normal and breath sounds normal. No respiratory distress. He has no wheezes. He has no rales. He exhibits no tenderness.  Clear anteriorly and posteriorly  Abdominal: Soft. Bowel sounds are normal. He exhibits no mass. There is no tenderness. There is no rebound and no guarding.  No abdominal tenderness in the epigastric area but there is generalized lower abdominal tenderness around the suprapubic catheter.  Musculoskeletal: He exhibits no edema or tenderness.  The patient has somewhat limited range of motion due to his upper back pain and lower back pain with radiation down the right leg.  Lymphadenopathy:    He has no cervical adenopathy.  Neurological: He is alert and oriented to person, place, and time.  Skin: Skin is warm and dry. No rash noted. No erythema. No pallor.  Psychiatric: He has a normal mood and affect. His behavior is normal. Judgment and thought content normal.  Nursing note and vitals reviewed.  BP 101/60 mmHg  Pulse 66  Temp(Src) 96.8 F (36 C) (Oral)  Ht 5' 8.5" (1.74 m)  Wt 154 lb (69.854 kg)  BMI 23.07 kg/m2  [facility] reading (PRIMARY) by  Dr. Laurance Flatten- chest x-ray and LS spine films --- there is an area of increased density in the right lower lung. 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                                     Assessment & Plan:  1. Vitamin B 12 deficiency -The patient will continue with his B12 injections pending results of lab work today. - CBC with Differential/Platelet - Vitamin B12  2. BPH (benign prostatic hyperplasia) -The patient will continue with regular follow-up by the urologist and suprapubic catheter care. - CBC with  Differential/Platelet  3. Hypothyroidism, unspecified hypothyroidism type -He should continue with current treatment pending results of lab work - CBC with Differential/Platelet  4. Hyperlipemia -Continue with current treatment pending results of lab work - BMP8+EGFR - Hepatic function panel - CBC with Differential/Platelet - Lipid panel - DG Chest 2 View; Future  5. Vitamin D deficiency -Continue with current treatment pending results of lab work - CBC with Differential/Platelet - Vit D  25 hydroxy (rtn osteoporosis monitoring)  6. Health care maintenance -He is having upper back pain and it was discussed with him he could take extra strength Tylenol as many as 4 daily. - DG Chest 2 View; Future  7. Right low back pain, with sciatica presence unspecified -Expiratory Tylenol for pain as directed - DG Lumbar Spine 2-3 Views; Future  8. Frailty -Increase efforts to make sure that he does not fall and hurt himself.  Patient Instructions  Medicare Annual Wellness Visit  [facility] and the medical providers at [facility] strive to bring you the best medical care.  In doing so we not only want to address your current medical conditions and concerns but also to detect new conditions early and prevent illness, disease and health-related problems.    Medicare offers a yearly Wellness Visit which allows our clinical staff to assess your need for preventative services including immunizations, lifestyle education, counseling to decrease risk of preventable diseases and screening for fall risk and other medical concerns.    This visit is provided free of charge (no copay) for all Medicare recipients. The clinical pharmacists at [facility] have begun to conduct these Wellness Visits which will also include a thorough review of all your medications.    As you primary medical provider recommend that you make an appointment  for your Annual Wellness Visit if you have not done so already this year.  You may set up this appointment before you leave today or you may call back ([phone]) and schedule an appointment.  Please make sure when you call that you mention that you are scheduling your Annual Wellness Visit with the clinical pharmacist so that the appointment may be made for the proper length of time.      Continue current medications. Continue good therapeutic lifestyle changes which include good diet and exercise. Fall precautions discussed with patient. If an FOBT was given today- please return it to our front desk. If you are over 48 years old - you may need Prevnar 69 or the adult Pneumonia vaccine.  **Flu shots will be available soon--- please call and schedule a FLU-CLINIC appointment**  After your visit with [place] today you will receive a survey in the mail or online from [employer] regarding your care with [place]. Please take a moment to fill this out. Your feedback is very important to [place] as you can help [place] better understand your patient needs as well as improve your experience and satisfaction. WE CARE ABOUT YOU!!!   **Please join [place] SEPT.22, 2016 from 5:00 to 7:00pm for our OPEN HOUSE! Come out and meet our NEW providers**  we will discuss with urology about the possibility of reducing the patient's terazosin as this may be playing a role with his low blood pressure and his weakness.  He should continue to follow-up with urology as planned  he should check with the lift chair people and see if he can find a lift chair that is more comfortable than the hard once he has tried.  he should continue to be careful and not putting himself at risk for falling  He should take extra strength Tylenol as needed for pain and no more than 4 daily.   Arrie Senate MD

## 2015-04-12 NOTE — Patient Instructions (Addendum)
Medicare Annual Wellness Visit  [place] and the medical providers at [facility] strive to bring you the best medical care.  In doing so we not only want to address your current medical conditions and concerns but also to detect new conditions early and prevent illness, disease and health-related problems.    Medicare offers a yearly Wellness Visit which allows our clinical staff to assess your need for preventative services including immunizations, lifestyle education, counseling to decrease risk of preventable diseases and screening for fall risk and other medical concerns.    This visit is provided free of charge (no copay) for all Medicare recipients. The clinical pharmacists at [facility] have begun to conduct these Wellness Visits which will also include a thorough review of all your medications.    As you primary medical provider recommend that you make an appointment for your Annual Wellness Visit if you have not done so already this year.  You may set up this appointment before you leave today or you may call back ([phone]) and schedule an appointment.  Please make sure when you call that you mention that you are scheduling your Annual Wellness Visit with the clinical pharmacist so that the appointment may be made for the proper length of time.      Continue current medications. Continue good therapeutic lifestyle changes which include good diet and exercise. Fall precautions discussed with patient. If an FOBT was given today- please return it to our front desk. If you are over 16 years old - you may need Prevnar 13 or the adult Pneumonia vaccine.  **Flu shots will be available soon--- please call and schedule a FLU-CLINIC appointment**  After your visit with [place] today you will receive a survey in the mail or online from [employer] regarding your care with [place]. Please take a moment to fill this out. Your feedback is  very important to [place] as you can help [place] better understand your patient needs as well as improve your experience and satisfaction. WE CARE ABOUT YOU!!!   **Please join [place] SEPT.22, 2016 from 5:00 to 7:00pm for our OPEN HOUSE! Come out and meet our NEW providers**  we will discuss with urology about the possibility of reducing the patient's terazosin as this may be playing a role with his low blood pressure and his weakness.  He should continue to follow-up with urology as planned  he should check with the lift chair people and see if he can find a lift chair that is more comfortable than the hard once he has tried.  he should continue to be careful and not putting himself at risk for falling  He should take extra strength Tylenol as needed for pain and no more than 4 daily.

## 2015-04-13 LAB — HEPATIC FUNCTION PANEL
ALK PHOS: 88 IU/L (ref 39–117)
ALT: 11 IU/L (ref 0–44)
AST: 61 IU/L — AB (ref 0–40)
Albumin: 3.7 g/dL (ref 3.2–4.6)
Bilirubin Total: 0.7 mg/dL (ref 0.0–1.2)
Bilirubin, Direct: 0.23 mg/dL (ref 0.00–0.40)
TOTAL PROTEIN: 6.5 g/dL (ref 6.0–8.5)

## 2015-04-13 LAB — CBC WITH DIFFERENTIAL/PLATELET
Basophils Absolute: 0 10*3/uL (ref 0.0–0.2)
Basos: 0 %
EOS (ABSOLUTE): 0.1 10*3/uL (ref 0.0–0.4)
Eos: 1 %
Hematocrit: 32.2 % — ABNORMAL LOW (ref 37.5–51.0)
Hemoglobin: 10.3 g/dL — ABNORMAL LOW (ref 12.6–17.7)
IMMATURE GRANULOCYTES: 0 %
Immature Grans (Abs): 0 10*3/uL (ref 0.0–0.1)
Lymphocytes Absolute: 1.7 10*3/uL (ref 0.7–3.1)
Lymphs: 30 %
MCH: 30.4 pg (ref 26.6–33.0)
MCHC: 32 g/dL (ref 31.5–35.7)
MCV: 95 fL (ref 79–97)
Monocytes Absolute: 0.5 10*3/uL (ref 0.1–0.9)
Monocytes: 9 %
NEUTROS PCT: 60 %
Neutrophils Absolute: 3.3 10*3/uL (ref 1.4–7.0)
PLATELETS: 202 10*3/uL (ref 150–379)
RBC: 3.39 x10E6/uL — AB (ref 4.14–5.80)
RDW: 14 % (ref 12.3–15.4)
WBC: 5.5 10*3/uL (ref 3.4–10.8)

## 2015-04-13 LAB — BMP8+EGFR
BUN / CREAT RATIO: 18 (ref 10–22)
BUN: 17 mg/dL (ref 10–36)
CO2: 26 mmol/L (ref 18–29)
CREATININE: 0.95 mg/dL (ref 0.76–1.27)
Calcium: 9.4 mg/dL (ref 8.6–10.2)
Chloride: 101 mmol/L (ref 97–108)
GFR calc Af Amer: 80 mL/min/{1.73_m2} (ref >59)
GFR, EST NON AFRICAN AMERICAN: 69 mL/min/{1.73_m2} (ref >59)
GLUCOSE: 134 mg/dL — AB (ref 65–99)
POTASSIUM: 3.9 mmol/L (ref 3.5–5.2)
SODIUM: 140 mmol/L (ref 134–144)

## 2015-04-13 LAB — LIPID PANEL
CHOLESTEROL TOTAL: 124 mg/dL (ref 100–199)
Chol/HDL Ratio: 3.6 ratio units (ref 0.0–5.0)
HDL: 34 mg/dL — AB (ref >39)
LDL Calculated: 68 mg/dL (ref 0–99)
TRIGLYCERIDES: 108 mg/dL (ref 0–149)
VLDL Cholesterol Cal: 22 mg/dL (ref 5–40)

## 2015-04-13 LAB — VITAMIN D 25 HYDROXY (VIT D DEFICIENCY, FRACTURES): Vit D, 25-Hydroxy: 61.1 ng/mL (ref 30.0–100.0)

## 2015-04-13 LAB — VITAMIN B12: Vitamin B-12: 416 pg/mL (ref 211–946)

## 2015-04-15 ENCOUNTER — Other Ambulatory Visit: Payer: Self-pay | Admitting: Pharmacist

## 2015-04-15 ENCOUNTER — Telehealth: Payer: Self-pay | Admitting: Family Medicine

## 2015-04-15 NOTE — Telephone Encounter (Signed)
Pt notified that there aren't any samples available at this time.

## 2015-04-17 ENCOUNTER — Ambulatory Visit (INDEPENDENT_AMBULATORY_CARE_PROVIDER_SITE_OTHER): Payer: Medicare Other

## 2015-04-17 DIAGNOSIS — E538 Deficiency of other specified B group vitamins: Secondary | ICD-10-CM | POA: Diagnosis not present

## 2015-04-19 ENCOUNTER — Other Ambulatory Visit: Payer: Self-pay | Admitting: *Deleted

## 2015-04-19 MED ORDER — INTEGRA PLUS PO CAPS: 1.0000 | ORAL_CAPSULE | Freq: Every day | ORAL | Status: AC

## 2015-04-22 ENCOUNTER — Other Ambulatory Visit: Payer: Self-pay | Admitting: *Deleted

## 2015-04-22 ENCOUNTER — Ambulatory Visit (INDEPENDENT_AMBULATORY_CARE_PROVIDER_SITE_OTHER): Payer: Medicare Other

## 2015-04-22 ENCOUNTER — Telehealth: Payer: Self-pay | Admitting: Family Medicine

## 2015-04-22 DIAGNOSIS — M546 Pain in thoracic spine: Secondary | ICD-10-CM | POA: Diagnosis not present

## 2015-04-22 NOTE — Telephone Encounter (Signed)
Jamie spoke with patient.  

## 2015-04-30 ENCOUNTER — Ambulatory Visit (INDEPENDENT_AMBULATORY_CARE_PROVIDER_SITE_OTHER): Payer: Medicare Other

## 2015-04-30 DIAGNOSIS — Z23 Encounter for immunization: Secondary | ICD-10-CM

## 2015-05-01 ENCOUNTER — Encounter: Payer: Self-pay | Admitting: Pediatrics

## 2015-05-01 ENCOUNTER — Telehealth: Payer: Self-pay | Admitting: Family Medicine

## 2015-05-01 ENCOUNTER — Ambulatory Visit (INDEPENDENT_AMBULATORY_CARE_PROVIDER_SITE_OTHER): Payer: Medicare Other | Admitting: Pediatrics

## 2015-05-01 VITALS — BP 135/68 | HR 65 | Temp 97.3°F | Ht 68.5 in | Wt 148.2 lb

## 2015-05-01 DIAGNOSIS — N390 Urinary tract infection, site not specified: Secondary | ICD-10-CM

## 2015-05-01 DIAGNOSIS — R8281 Pyuria: Secondary | ICD-10-CM

## 2015-05-01 DIAGNOSIS — R634 Abnormal weight loss: Secondary | ICD-10-CM

## 2015-05-01 DIAGNOSIS — R1030 Lower abdominal pain, unspecified: Secondary | ICD-10-CM | POA: Diagnosis not present

## 2015-05-01 DIAGNOSIS — R531 Weakness: Secondary | ICD-10-CM | POA: Diagnosis not present

## 2015-05-01 DIAGNOSIS — D649 Anemia, unspecified: Secondary | ICD-10-CM

## 2015-05-01 DIAGNOSIS — R5383 Other fatigue: Secondary | ICD-10-CM

## 2015-05-01 LAB — POCT URINALYSIS DIPSTICK

## 2015-05-01 LAB — POCT UA - MICROSCOPIC ONLY
CASTS, UR, LPF, POC: NEGATIVE
CRYSTALS, UR, HPF, POC: NEGATIVE
Epithelial cells, urine per micros: NEGATIVE
RBC, urine, microscopic: NEGATIVE
Yeast, UA: NEGATIVE

## 2015-05-01 NOTE — Telephone Encounter (Signed)
appt made

## 2015-05-01 NOTE — Progress Notes (Signed)
Subjective:    Patient ID: Luke Carter, male    DOB: 07-17-1921, 81 y.o.   MRN: [id number]  HPI: Luke Carter is a 81 y.o. male presenting on 05/01/2015 for Fatigue  Has not been feeling well.  Someone nnoticed he was looking pale, no appetite.  Says he has no energy,  Appetite has been terrible. Last stool this morning, less than usual, not hard. No blood or dark color to stool.  When he walks much he does have SOB, new over the last few weeks.  Has had runny nose more than recently, eyes running. One morning/evening last week he did feel like he had a cold, the next morning felt well. No sore throat, no coughing. Has suprapubic cath in place, has not noticed any change other than urine now orange, had started azo at rec of his urologist for penile burning. Has little to no voiding through urethra at this time.   Relevant past medical, surgical, family and social history reviewed and updated as indicated. Interim medical history since our last visit reviewed. Allergies and medications reviewed and updated.   ROS: All systems negative other than what is in HPI.  Past Medical History Patient Active Problem List   Diagnosis Date Noted  . Urinary incontinence 07/13/2013  . Vitamin B12 deficiency 02/15/2013  . Hyperlipemia 02/15/2013  . GERD (gastroesophageal reflux disease) 02/15/2013  . Hypothyroid 02/15/2013  . BPH (benign prostatic hypertrophy) 02/15/2013    Current Outpatient Prescriptions  Medication Sig Dispense Refill  . Calcium Carbonate-Vit D-Min 1200-1000 MG-UNIT CHEW Chew 1 tablet by mouth daily.    . Cyanocobalamin (B-12 COMPLIANCE INJECTION IJ) Inject as directed every 30 (thirty) days. Last injection [phone] approx    . FeFum-FePoly-FA-B Cmp-C-Biot (INTEGRA PLUS) CAPS Take 1 capsule by mouth daily. 30 capsule 3  . finasteride (PROSCAR) 5 MG tablet Take 5 mg by mouth daily.    . fish oil-omega-3 fatty acids 1000 MG capsule Take 2 g by mouth daily.      Marland Kitchen levothyroxine (SYNTHROID, LEVOTHROID) 50 MCG tablet Take 50 mcg. Mon, Wed, and Friday. 75 mcg all other days 60 tablet 5  . Multiple Vitamin (MULTIVITAMIN) tablet Take 1 tablet by mouth daily.    Marland Kitchen omeprazole (PRILOSEC) 20 MG capsule Take 20 mg by mouth 2 (two) times daily.    . polyethylene glycol (MIRALAX / GLYCOLAX) packet Take 17 g by mouth daily as needed for moderate constipation or severe constipation.    . pravastatin (PRAVACHOL) 40 MG tablet Take 40 mg by mouth daily.     Current Facility-Administered Medications  Medication Dose Route Frequency Provider Last Rate Last Dose  . cyanocobalamin ((VITAMIN B-12)) injection 1,000 mcg  1,000 mcg Intramuscular Q30 days Chipper Herb, MD   1,000 mcg at 04/17/15 1119       Objective:    BP 135/68 mmHg  Pulse 65  Temp(Src) 97.3 F (36.3 C) (Oral)  Ht 5' 8.5" (1.74 m)  Wt 148 lb 3.2 oz (67.223 kg)  BMI 22.20 kg/m2  Wt Readings from Last 3 Encounters:  05/01/15 148 lb 3.2 oz (67.223 kg)  04/12/15 154 lb (69.854 kg)  11/23/14 154 lb (69.854 kg)     Gen: NAD, alert, thin, elderly gentleman  EYES: EOMI, no scleral injection or icterus ENT:  TMs pearly gray b/l, OP without erythema LYMPH: no cervical LAD CV: NRRR, normal S1/S2, no murmur, No edema, warm Resp: CTABL, no wheezes, normal WOB Abd: +BS, soft, NTND. Suprapubic  cath in place. Minimal amount of brown mucus discharge on dressing over area. Some granulation tissue present. Neuro: Alert and oriented     Assessment & Plan:   Ison was seen today for fatigue, ddx is broad, including infection, will check urine for UTI, at higher risk with suprapubic cath though pt says he has never had one before. Also will recheck his thyroid, send an iron panel to see if iron deficient as he had a decreasing hemoglobin with a high normal MCV at last check and iron has been disagreeing with his stomach. Has lost 6 lbs over past 2-3 weeks with poor appetite. Will check blood smear. Non-focal  exam.  Diagnoses and all orders for this visit:  Other fatigue -     Thyroid Panel With TSH -     CBC with Differential -     CMP14+EGFR -     POCT urinalysis dipstick  Lower abdominal pain -     CMP14+EGFR -     POCT urinalysis dipstick -     POCT UA - Microscopic Only  Anemia, unspecified anemia type -     IBC Panel -     Save smear  Loss of weight  Follow up plan: Return in about 2 weeks (around 05/15/2015) for recheck fatigue.  Assunta Found, MD [facility] 05/01/2015, 12:28 PM

## 2015-05-02 LAB — CBC WITH DIFFERENTIAL/PLATELET
BASOS: 0 %
Basophils Absolute: 0 10*3/uL (ref 0.0–0.2)
EOS (ABSOLUTE): 0 10*3/uL (ref 0.0–0.4)
Eos: 1 %
Hematocrit: 32.4 % — ABNORMAL LOW (ref 37.5–51.0)
Hemoglobin: 10.8 g/dL — ABNORMAL LOW (ref 12.6–17.7)
Immature Grans (Abs): 0.1 10*3/uL (ref 0.0–0.1)
Immature Granulocytes: 1 %
Lymphocytes Absolute: 2.1 10*3/uL (ref 0.7–3.1)
Lymphs: 34 %
MCH: 30.4 pg (ref 26.6–33.0)
MCHC: 33.3 g/dL (ref 31.5–35.7)
MCV: 91 fL (ref 79–97)
MONOS ABS: 0.8 10*3/uL (ref 0.1–0.9)
Monocytes: 13 %
NEUTROS ABS: 3.1 10*3/uL (ref 1.4–7.0)
Neutrophils: 51 %
Platelets: 232 10*3/uL (ref 150–379)
RBC: 3.55 x10E6/uL — ABNORMAL LOW (ref 4.14–5.80)
RDW: 13.7 % (ref 12.3–15.4)
WBC: 6.1 10*3/uL (ref 3.4–10.8)

## 2015-05-02 LAB — CMP14+EGFR
A/G RATIO: 1.2 (ref 1.1–2.5)
ALK PHOS: 102 IU/L (ref 39–117)
ALT: 10 IU/L (ref 0–44)
AST: 78 IU/L — ABNORMAL HIGH (ref 0–40)
Albumin: 3.6 g/dL (ref 3.2–4.6)
BILIRUBIN TOTAL: 0.7 mg/dL (ref 0.0–1.2)
BUN / CREAT RATIO: 24 — AB (ref 10–22)
BUN: 22 mg/dL (ref 10–36)
CO2: 25 mmol/L (ref 18–29)
Calcium: 9.5 mg/dL (ref 8.6–10.2)
Chloride: 100 mmol/L (ref 97–108)
Creatinine, Ser: 0.93 mg/dL (ref 0.76–1.27)
GFR calc non Af Amer: 71 mL/min/{1.73_m2} (ref >59)
GFR, EST AFRICAN AMERICAN: 82 mL/min/{1.73_m2} (ref >59)
Globulin, Total: 3 g/dL (ref 1.5–4.5)
Glucose: 96 mg/dL (ref 65–99)
POTASSIUM: 4.1 mmol/L (ref 3.5–5.2)
Sodium: 140 mmol/L (ref 134–144)
Total Protein: 6.6 g/dL (ref 6.0–8.5)

## 2015-05-02 LAB — THYROID PANEL WITH TSH
Free Thyroxine Index: 2.7 (ref 1.2–4.9)
T3 UPTAKE RATIO: 35 % (ref 24–39)
T4, Total: 7.8 ug/dL (ref 4.5–12.0)
TSH: 2.57 u[IU]/mL (ref 0.450–4.500)

## 2015-05-02 MED ORDER — CIPROFLOXACIN HCL 500 MG PO TABS: 500.0000 mg | ORAL_TABLET | Freq: Two times a day (BID) | ORAL | Status: DC

## 2015-05-02 NOTE — Addendum Note (Signed)
Addended by: Johna Sheriff on: 05/02/2015 10:44 AM   Modules accepted: Orders

## 2015-05-03 ENCOUNTER — Other Ambulatory Visit: Payer: Self-pay | Admitting: Nurse Practitioner

## 2015-05-03 LAB — URINE CULTURE

## 2015-05-07 ENCOUNTER — Other Ambulatory Visit: Payer: Self-pay | Admitting: *Deleted

## 2015-05-07 ENCOUNTER — Other Ambulatory Visit (INDEPENDENT_AMBULATORY_CARE_PROVIDER_SITE_OTHER): Payer: Medicare Other

## 2015-05-07 ENCOUNTER — Telehealth: Payer: Self-pay | Admitting: *Deleted

## 2015-05-07 DIAGNOSIS — R300 Dysuria: Secondary | ICD-10-CM | POA: Diagnosis not present

## 2015-05-07 DIAGNOSIS — R5383 Other fatigue: Secondary | ICD-10-CM | POA: Diagnosis not present

## 2015-05-07 DIAGNOSIS — R531 Weakness: Secondary | ICD-10-CM

## 2015-05-07 LAB — POCT URINALYSIS DIPSTICK

## 2015-05-07 LAB — POCT UA - MICROSCOPIC ONLY
Casts, Ur, LPF, POC: NEGATIVE
Crystals, Ur, HPF, POC: NEGATIVE
Mucus, UA: NEGATIVE
YEAST UA: NEGATIVE

## 2015-05-07 MED ORDER — TRAMADOL HCL 50 MG PO TABS: 50.0000 mg | ORAL_TABLET | Freq: Three times a day (TID) | ORAL | Status: DC | PRN

## 2015-05-07 NOTE — Telephone Encounter (Signed)
Patient came in for labs and urinalysis. Hgb 9 which is lower than the past. CBC pending.  BP 110/76 Temp 97.8  Dr Christell Constant spoke with and examined patient.  Rectal exam and hemoccult were negative.  Heart RRR at 60 per min, lungs clear, lower abd tenderness where suprapubic cather is in place  Dr Christell Constant will speak with Dr McDiarmid tomorrow about follow up.

## 2015-05-08 LAB — CBC WITH DIFFERENTIAL/PLATELET
BASOS: 0 %
Basophils Absolute: 0 10*3/uL (ref 0.0–0.2)
EOS (ABSOLUTE): 0 10*3/uL (ref 0.0–0.4)
Eos: 1 %
Hematocrit: 33.4 % — ABNORMAL LOW (ref 37.5–51.0)
Hemoglobin: 11 g/dL — ABNORMAL LOW (ref 12.6–17.7)
IMMATURE GRANS (ABS): 0.2 10*3/uL — AB (ref 0.0–0.1)
IMMATURE GRANULOCYTES: 2 %
Lymphocytes Absolute: 2.8 10*3/uL (ref 0.7–3.1)
Lymphs: 34 %
MCH: 29.6 pg (ref 26.6–33.0)
MCHC: 32.9 g/dL (ref 31.5–35.7)
MCV: 90 fL (ref 79–97)
MONOCYTES: 9 %
Monocytes Absolute: 0.8 10*3/uL (ref 0.1–0.9)
NEUTROS PCT: 54 %
Neutrophils Absolute: 4.6 10*3/uL (ref 1.4–7.0)
PLATELETS: 269 10*3/uL (ref 150–379)
RBC: 3.72 x10E6/uL — ABNORMAL LOW (ref 4.14–5.80)
RDW: 13.8 % (ref 12.3–15.4)
WBC: 8.4 10*3/uL (ref 3.4–10.8)

## 2015-05-08 LAB — BMP8+EGFR
BUN/Creatinine Ratio: 20 (ref 10–22)
BUN: 21 mg/dL (ref 10–36)
CALCIUM: 9.7 mg/dL (ref 8.6–10.2)
CO2: 23 mmol/L (ref 18–29)
Chloride: 101 mmol/L (ref 97–108)
Creatinine, Ser: 1.04 mg/dL (ref 0.76–1.27)
GFR calc Af Amer: 72 mL/min/{1.73_m2} (ref >59)
GFR, EST NON AFRICAN AMERICAN: 62 mL/min/{1.73_m2} (ref >59)
Glucose: 141 mg/dL — ABNORMAL HIGH (ref 65–99)
POTASSIUM: 4.9 mmol/L (ref 3.5–5.2)
Sodium: 143 mmol/L (ref 134–144)

## 2015-05-10 LAB — URINE CULTURE

## 2015-05-13 ENCOUNTER — Ambulatory Visit (INDEPENDENT_AMBULATORY_CARE_PROVIDER_SITE_OTHER): Payer: Medicare Other | Admitting: Family Medicine

## 2015-05-13 ENCOUNTER — Encounter: Payer: Self-pay | Admitting: Family Medicine

## 2015-05-13 ENCOUNTER — Encounter (HOSPITAL_COMMUNITY): Payer: Self-pay | Admitting: *Deleted

## 2015-05-13 ENCOUNTER — Emergency Department (HOSPITAL_COMMUNITY): Payer: Medicare Other

## 2015-05-13 ENCOUNTER — Inpatient Hospital Stay (HOSPITAL_COMMUNITY)
Admission: EM | Admit: 2015-05-13 | Discharge: 2015-05-16 | DRG: 698 | Disposition: A | Discharge status: Skilled Nursing Facility | Payer: Medicare Other | Attending: Internal Medicine | Admitting: Internal Medicine

## 2015-05-13 ENCOUNTER — Ambulatory Visit (INDEPENDENT_AMBULATORY_CARE_PROVIDER_SITE_OTHER): Payer: Medicare Other

## 2015-05-13 VITALS — BP 134/67 | HR 84 | Temp 97.1°F | Ht 68.5 in | Wt 141.8 lb

## 2015-05-13 DIAGNOSIS — K219 Gastro-esophageal reflux disease without esophagitis: Secondary | ICD-10-CM | POA: Diagnosis present

## 2015-05-13 DIAGNOSIS — R079 Chest pain, unspecified: Secondary | ICD-10-CM

## 2015-05-13 DIAGNOSIS — K5900 Constipation, unspecified: Secondary | ICD-10-CM | POA: Diagnosis present

## 2015-05-13 DIAGNOSIS — Z6821 Body mass index (BMI) 21.0-21.9, adult: Secondary | ICD-10-CM

## 2015-05-13 DIAGNOSIS — N138 Other obstructive and reflux uropathy: Secondary | ICD-10-CM | POA: Diagnosis present

## 2015-05-13 DIAGNOSIS — E785 Hyperlipidemia, unspecified: Secondary | ICD-10-CM | POA: Diagnosis present

## 2015-05-13 DIAGNOSIS — E43 Unspecified severe protein-calorie malnutrition: Secondary | ICD-10-CM | POA: Diagnosis present

## 2015-05-13 DIAGNOSIS — T83510A Infection and inflammatory reaction due to cystostomy catheter, initial encounter: Secondary | ICD-10-CM | POA: Diagnosis not present

## 2015-05-13 DIAGNOSIS — J449 Chronic obstructive pulmonary disease, unspecified: Secondary | ICD-10-CM | POA: Diagnosis present

## 2015-05-13 DIAGNOSIS — D649 Anemia, unspecified: Secondary | ICD-10-CM | POA: Diagnosis present

## 2015-05-13 DIAGNOSIS — N400 Enlarged prostate without lower urinary tract symptoms: Secondary | ICD-10-CM | POA: Diagnosis present

## 2015-05-13 DIAGNOSIS — N3090 Cystitis, unspecified without hematuria: Secondary | ICD-10-CM

## 2015-05-13 DIAGNOSIS — N320 Bladder-neck obstruction: Secondary | ICD-10-CM | POA: Diagnosis present

## 2015-05-13 DIAGNOSIS — R531 Weakness: Secondary | ICD-10-CM

## 2015-05-13 DIAGNOSIS — M170 Bilateral primary osteoarthritis of knee: Secondary | ICD-10-CM | POA: Diagnosis present

## 2015-05-13 DIAGNOSIS — N3000 Acute cystitis without hematuria: Secondary | ICD-10-CM | POA: Diagnosis present

## 2015-05-13 DIAGNOSIS — R1030 Lower abdominal pain, unspecified: Secondary | ICD-10-CM | POA: Diagnosis not present

## 2015-05-13 DIAGNOSIS — E039 Hypothyroidism, unspecified: Secondary | ICD-10-CM | POA: Diagnosis present

## 2015-05-13 DIAGNOSIS — N401 Enlarged prostate with lower urinary tract symptoms: Secondary | ICD-10-CM | POA: Diagnosis present

## 2015-05-13 DIAGNOSIS — N390 Urinary tract infection, site not specified: Secondary | ICD-10-CM | POA: Diagnosis present

## 2015-05-13 DIAGNOSIS — Z66 Do not resuscitate: Secondary | ICD-10-CM | POA: Diagnosis present

## 2015-05-13 LAB — HEPATIC FUNCTION PANEL
ALK PHOS: 112 U/L (ref 38–126)
ALT: 23 U/L (ref 17–63)
AST: 124 U/L — ABNORMAL HIGH (ref 15–41)
Albumin: 3 g/dL — ABNORMAL LOW (ref 3.5–5.0)
BILIRUBIN INDIRECT: 0.6 mg/dL (ref 0.3–0.9)
BILIRUBIN TOTAL: 0.8 mg/dL (ref 0.3–1.2)
Bilirubin, Direct: 0.2 mg/dL (ref 0.1–0.5)
Total Protein: 6.9 g/dL (ref 6.5–8.1)

## 2015-05-13 LAB — LIPASE, BLOOD: LIPASE: 19 U/L — AB (ref 22–51)

## 2015-05-13 LAB — CBC
HCT: 32 % — ABNORMAL LOW (ref 39.0–52.0)
Hemoglobin: 10.6 g/dL — ABNORMAL LOW (ref 13.0–17.0)
MCH: 30 pg (ref 26.0–34.0)
MCHC: 33.1 g/dL (ref 30.0–36.0)
MCV: 90.7 fL (ref 78.0–100.0)
PLATELETS: 235 10*3/uL (ref 150–400)
RBC: 3.53 MIL/uL — ABNORMAL LOW (ref 4.22–5.81)
RDW: 13.4 % (ref 11.5–15.5)
WBC: 6.8 10*3/uL (ref 4.0–10.5)

## 2015-05-13 LAB — BASIC METABOLIC PANEL
Anion gap: 10 (ref 5–15)
BUN: 23 mg/dL — AB (ref 6–20)
CHLORIDE: 104 mmol/L (ref 101–111)
CO2: 23 mmol/L (ref 22–32)
CREATININE: 1.07 mg/dL (ref 0.61–1.24)
Calcium: 9.4 mg/dL (ref 8.9–10.3)
GFR calc Af Amer: >60 mL/min (ref >60)
GFR calc non Af Amer: 58 mL/min — ABNORMAL LOW (ref >60)
Glucose, Bld: 133 mg/dL — ABNORMAL HIGH (ref 65–99)
Potassium: 3.9 mmol/L (ref 3.5–5.1)
Sodium: 137 mmol/L (ref 135–145)

## 2015-05-13 LAB — I-STAT TROPONIN, ED
Troponin i, poc: 0.02 ng/mL (ref 0.00–0.08)
Troponin i, poc: 0.02 ng/mL (ref 0.00–0.08)

## 2015-05-13 NOTE — ED Notes (Signed)
Pt was seen by his MD today, but could not find causes, and was told to come to the ER.

## 2015-05-13 NOTE — ED Notes (Signed)
Per Thayer Ohmhris in CT, there are 2 people ahead of the pt.

## 2015-05-13 NOTE — ED Provider Notes (Signed)
CSN: [id number]     Arrival date & time 05/13/15  1854 History   First MD Initiated Contact with Patient 05/13/15 2108     Chief Complaint  Patient presents with  . Chest Pain   Patient is a 81 y.o. male presenting with weakness.  Weakness This is a chronic problem. The current episode started more than 1 month ago. The problem occurs constantly. The problem has been unchanged. Associated symptoms include abdominal pain, anorexia, chest pain, fatigue, nausea and weakness. Pertinent negatives include no congestion, coughing, diaphoresis, fever, headaches, myalgias, numbness, sore throat, visual change or vomiting.    Past Medical History  Diagnosis Date  . Hyperlipidemia   . BPH (benign prostatic hyperplasia)   . GERD (gastroesophageal reflux disease)   . Chronic constipation   . Urinary incontinence   . Urinary retention   . Wears glasses   . Wears hearing aid     bilateral  . OA (osteoarthritis) of knee     bilateral   Past Surgical History  Procedure Laterality Date  . Vocal cord surgery with gortex graft  2000    [facility]  . Tonsillectomy  as child  . Knee surgery Left age 81  . Cysto N/A 10/11/2014    Procedure: CYSTOSCOPY, URETHERAL DILATION, PLACEMENT OF SUPRAPUBIC TUBE;  Surgeon: Barron Alvineavid Grapey, MD;  [facility];  Service: Urology;  Laterality: N/A;   History reviewed. No pertinent family history. Social History  Substance Use Topics  . Smoking status: Never Smoker   . Smokeless tobacco: Never Used  . Alcohol Use: No    Review of Systems  Constitutional: Positive for fatigue. Negative for fever and diaphoresis.  HENT: Negative for congestion and sore throat.   Respiratory: Negative for cough.   Cardiovascular: Positive for chest pain.  Gastrointestinal: Positive for nausea, abdominal pain and anorexia. Negative for vomiting.  Musculoskeletal: Negative for myalgias.  Neurological: Positive for weakness. Negative for numbness and headaches.   All other systems reviewed and are negative.   Allergies  Review of patient's allergies indicates no known allergies.  Home Medications   Prior to Admission medications   Medication Sig Start Date End Date Taking? Authorizing Provider  Calcium Carbonate-Vit D-Min 1200-1000 MG-UNIT CHEW Chew 1 tablet by mouth daily.   Yes Historical Provider, MD  Cyanocobalamin (B-12 COMPLIANCE INJECTION IJ) Inject as directed every 30 (thirty) days. Last injection [phone] approx   Yes Historical Provider, MD  FeFum-FePoly-FA-B Cmp-C-Biot (INTEGRA PLUS) CAPS Take 1 capsule by mouth daily. 04/19/15  Yes Ernestina Pennaonald W Moore, MD  finasteride (PROSCAR) 5 MG tablet Take 5 mg by mouth daily.   Yes Historical Provider, MD  fish oil-omega-3 fatty acids 1000 MG capsule Take 2 g by mouth daily.   Yes Historical Provider, MD  Influenza vac split quadrivalent PF (FLUARIX) 0.5 ML injection Inject 0.5 mLs into the muscle once.   Yes Historical Provider, MD  levothyroxine (SYNTHROID, LEVOTHROID) 50 MCG tablet TAKE 1 TABLET MONDAY, WEDNESDAY AND FRIDAY AND 1 1/2 TABLETS ALL OTHER DAYS 05/03/15  Yes Ernestina Pennaonald W Moore, MD  Multiple Vitamin (MULTIVITAMIN) tablet Take 1 tablet by mouth daily.   Yes Historical Provider, MD  omeprazole (PRILOSEC) 20 MG capsule Take 20 mg by mouth 2 (two) times daily.   Yes Historical Provider, MD  polyethylene glycol (MIRALAX / GLYCOLAX) packet Take 17 g by mouth daily as needed for moderate constipation or severe constipation.   Yes Historical Provider, MD  pravastatin (PRAVACHOL) 40 MG tablet Take 40  mg by mouth daily.   Yes Historical Provider, MD  ciprofloxacin (CIPRO) 500 MG tablet Take 500 mg by mouth 2 (two) times daily. 05/02/15   Historical Provider, MD   BP 139/58 mmHg  Pulse 74  Temp(Src) 98.3 F (36.8 C) (Oral)  Resp 24  SpO2 98%   Physical Exam  Constitutional: He is oriented to person, place, and time. He appears well-developed and well-nourished. No distress.  HENT:  Head:  Normocephalic.  Eyes: Pupils are equal, round, and reactive to light.  Neck: Normal range of motion.  Cardiovascular: Normal rate.   No murmur heard. Abdominal: He exhibits no distension and no mass. There is tenderness. There is guarding. There is no rebound.  Musculoskeletal: Normal range of motion. He exhibits no edema.  Neurological: He is alert and oriented to person, place, and time. No cranial nerve deficit. Coordination normal.  Skin: Skin is warm and dry. He is not diaphoretic. No erythema.  Psychiatric: He has a normal mood and affect. His behavior is normal. Judgment and thought content normal.  Nursing note and vitals reviewed.   ED Course  Procedures (including critical care time) Labs Review Labs Reviewed  BASIC METABOLIC PANEL - Abnormal; Notable for the following:    Glucose, Bld 133 (*)    BUN 23 (*)    GFR calc non Af Amer 58 (*)    All other components within normal limits  CBC - Abnormal; Notable for the following:    RBC 3.53 (*)    Hemoglobin 10.6 (*)    HCT 32.0 (*)    All other components within normal limits  HEPATIC FUNCTION PANEL - Abnormal; Notable for the following:    Albumin 3.0 (*)    AST 124 (*)    All other components within normal limits  LIPASE, BLOOD - Abnormal; Notable for the following:    Lipase 19 (*)    All other components within normal limits  URINE CULTURE  URINALYSIS, ROUTINE W REFLEX MICROSCOPIC (NOT AT [facility])  I-STAT TROPOININ, ED  I-STAT TROPOININ, ED   Imaging Review Dg Chest 2 View  05/13/2015  CLINICAL DATA:  Pain across chest for 3-4 weeks, loss of appetite EXAM: CHEST  2 VIEW COMPARISON:  Earlier exam of 05/13/2015 ; correlation CT abdomen/pelvis 02/15/2010 FINDINGS: Normal heart size and pulmonary vascularity. Atherosclerotic calcification aorta. Chronic RIGHT anterior diaphragmatic hernia containing fat and bowel unchanged. Emphysematous changes with minimal central peribronchial thickening consistent with COPD. No  pulmonary infiltrate, pleural effusion or pneumothorax. Calcified granuloma lower RIGHT chest stable. Bones demineralized. IMPRESSION: COPD changes. Chronic anterior RIGHT diaphragmatic hernia. Electronically Signed   By: Ulyses Southward M.D.   On: 05/13/2015 20:25   Dg Chest 2 View  05/13/2015  CLINICAL DATA:  Chest pain. EXAM: CHEST - 2 VIEW COMPARISON:  Two-view chest x-ray 04/12/2015.  CT chest 08/06/2009 FINDINGS: The heart size is normal. Atherosclerotic calcifications are present at the arch. Calcified lymph nodes are present. Emphysematous changes are again seen. A large anterior diaphragmatic hernia is present on the right. The lungs are otherwise clear apart from 2 right-sided granulomata. There is fusion of anterior osteophytes across multiple levels of the thoracic spine. IMPRESSION: 1. No acute cardiopulmonary disease or significant interval change. 2. Chronic anterior right diaphragmatic hernia. 3. Emphysema. 4. Calcified lymph nodes and right-sided granulomata compatible with prior granulomatous disease. 5. DISH of the thoracic spine. Electronically Signed   By: Marin Roberts M.D.   On: 05/13/2015 15:58   I have personally  reviewed and evaluated these images and lab results as part of my medical decision-making.   EKG Interpretation   Date/Time:  Monday May 13 2015 18:59:07 EDT Ventricular Rate:  84 PR Interval:  206 QRS Duration: 112 QT Interval:  404 QTC Calculation: 477 R Axis:   63 Text Interpretation:  Normal sinus rhythm Septal infarct , age  undetermined Abnormal ECG No significant change since last tracing  Confirmed by Mirian Mo [phone]) on 05/14/2015 12:15:28 AM      MDM   Mr. Piatkowski is a 81 y/o male with a PMHx of hypothyroidism and hypercholesterolemia presenting with chest and abdominal pain for the past three months. Pain is substernal, described as a discomfort that radiated down into the abdomen bilaterally. Pain is worsened by exertion, is not  affected by food or rest.  Denies diaphoresis, nausea, and vomiting, but does have intermittent palpitations.  Per family, the patient has been weak and fatigued for the past three weeks as well.  Decreased appetite and had lost 15 lbs in the past month. He has a suprapubic catheter in place secondary to BPH.  Is on the last day of Cipro 500 mg BID for a UTI.   Abdominal exam is remarkable for suprapubic tenderness. CT shows concerns for cystitis. UA positive for nitrites, large leukocytes and urine culture added. Given diffuse weakness, CT findings, will admit for observation and IV antibiotics.   Final diagnoses:  Cystitis      Deirdre Peer, MD 05/14/15 1244  Mirian Mo, MD 05/16/15 [phone]

## 2015-05-13 NOTE — Progress Notes (Signed)
   HPI  Patient presents today for evaluation of chest pain.  Patient explains that he has tightness type chest pain which is constant in his central chest, radiates circumferentially around his chest and has continued over the last 3 weeks He gets worse with inconsistently with exertion, he states that he has continuous symptoms even at rest. He notes malaise, dyspnea, and cough with activity that have all been going on for much longer than this current episode.  Bladder spasms He's had a leak of his suprapubic catheter for about 2 or 3 weeks. He was treated with Cipro for UTI and those symptoms have resolved. He has not seen urologist in quite a while.    PMH: Smoking status noted ROS: Per HPI  Objective: BP 134/67 mmHg  Pulse 84  Temp(Src) 97.1 F (36.2 C) (Oral)  Ht 5' 8.5" (1.74 m)  Wt 141 lb 12.8 oz (64.32 kg)  BMI 21.24 kg/m2 Gen: NAD, alert, cooperative with exam HEENT: NCAT CV: RRR, good S1/S2, no murmur Resp: CTABL, no wheezes, non-labored Abd: Soft, tender throughout, no guarding, suparpubic catheter with drainage on a pad overlying the orofice Ext: No edema, warm Neuro: Alert and oriented, 5/5 and sensation intact in all 4 extremities, cranial nerves II through XII intact, normal gait  Chest x-ray: 05/13/2015: No acute infiltrate  EKG: Nonspecific T-wave changes in the lateral leads  Assessment and plan:  # Chest pain EKG today with nonspecific T-wave changes Chest x-ray without any explanation, no acute infiltrate Considering age and risk factors including hyperlipidemia I recommended that he be seen in the emergency room today. Recommended ambulance transfer, however he would like to make arrangements for his family to take him, I did not argue with this as he has had symptoms for 3 weeks that are unchanged.   Suprapubic catheter leak Recommended close urology follow-up for possibly getting a larger catheter placed     Orders Placed This Encounter    Procedures  . DG Chest 2 View    Standing Status: Future     Number of Occurrences:      Standing Expiration Date: 07/12/2016    Order Specific Question:  Reason for Exam (SYMPTOM  OR DIAGNOSIS REQUIRED)    Answer:  chest pain    Order Specific Question:  Preferred imaging location?    Answer:  Internal  . DG Chest 2 View    Standing Status: Future     Number of Occurrences:      Standing Expiration Date: 07/12/2016    Order Specific Question:  Reason for Exam (SYMPTOM  OR DIAGNOSIS REQUIRED)    Answer:  chest pain    Order Specific Question:  Preferred imaging location?    Answer:  Internal  . BMP8+EGFR  . CBC with Differential/Platelet  . EKG 12-Lead     Laroy Apple, MD [facility] 05/13/2015, 3:42 PM

## 2015-05-13 NOTE — Patient Instructions (Signed)
Please go to the emergency room for evaluation. 

## 2015-05-13 NOTE — ED Notes (Signed)
Pt reports having pain across his chest x 3-4 weeks. Denies sob but reports lack of appetite. No acute distress noted, ekg done.

## 2015-05-14 ENCOUNTER — Encounter (HOSPITAL_COMMUNITY): Payer: Self-pay | Admitting: Internal Medicine

## 2015-05-14 DIAGNOSIS — Z6821 Body mass index (BMI) 21.0-21.9, adult: Secondary | ICD-10-CM | POA: Diagnosis not present

## 2015-05-14 DIAGNOSIS — N400 Enlarged prostate without lower urinary tract symptoms: Secondary | ICD-10-CM | POA: Diagnosis not present

## 2015-05-14 DIAGNOSIS — D649 Anemia, unspecified: Secondary | ICD-10-CM | POA: Diagnosis present

## 2015-05-14 DIAGNOSIS — N320 Bladder-neck obstruction: Secondary | ICD-10-CM | POA: Diagnosis present

## 2015-05-14 DIAGNOSIS — J449 Chronic obstructive pulmonary disease, unspecified: Secondary | ICD-10-CM | POA: Diagnosis present

## 2015-05-14 DIAGNOSIS — N3090 Cystitis, unspecified without hematuria: Secondary | ICD-10-CM | POA: Diagnosis not present

## 2015-05-14 DIAGNOSIS — R531 Weakness: Secondary | ICD-10-CM

## 2015-05-14 DIAGNOSIS — N401 Enlarged prostate with lower urinary tract symptoms: Secondary | ICD-10-CM | POA: Diagnosis present

## 2015-05-14 DIAGNOSIS — K5900 Constipation, unspecified: Secondary | ICD-10-CM | POA: Diagnosis present

## 2015-05-14 DIAGNOSIS — N390 Urinary tract infection, site not specified: Secondary | ICD-10-CM | POA: Diagnosis not present

## 2015-05-14 DIAGNOSIS — K219 Gastro-esophageal reflux disease without esophagitis: Secondary | ICD-10-CM | POA: Diagnosis present

## 2015-05-14 DIAGNOSIS — T83510A Infection and inflammatory reaction due to cystostomy catheter, initial encounter: Secondary | ICD-10-CM | POA: Diagnosis present

## 2015-05-14 DIAGNOSIS — E039 Hypothyroidism, unspecified: Secondary | ICD-10-CM | POA: Diagnosis not present

## 2015-05-14 DIAGNOSIS — N3000 Acute cystitis without hematuria: Secondary | ICD-10-CM | POA: Diagnosis present

## 2015-05-14 DIAGNOSIS — E43 Unspecified severe protein-calorie malnutrition: Secondary | ICD-10-CM | POA: Diagnosis not present

## 2015-05-14 DIAGNOSIS — E785 Hyperlipidemia, unspecified: Secondary | ICD-10-CM | POA: Diagnosis present

## 2015-05-14 DIAGNOSIS — R1030 Lower abdominal pain, unspecified: Secondary | ICD-10-CM | POA: Diagnosis present

## 2015-05-14 DIAGNOSIS — Z66 Do not resuscitate: Secondary | ICD-10-CM | POA: Diagnosis present

## 2015-05-14 DIAGNOSIS — M170 Bilateral primary osteoarthritis of knee: Secondary | ICD-10-CM | POA: Diagnosis present

## 2015-05-14 DIAGNOSIS — N138 Other obstructive and reflux uropathy: Secondary | ICD-10-CM | POA: Diagnosis present

## 2015-05-14 LAB — CBC WITH DIFFERENTIAL/PLATELET
BASOS ABS: 0 10*3/uL (ref 0.0–0.2)
BASOS PCT: 0 %
Basophils Absolute: 0 10*3/uL (ref 0.0–0.1)
Basos: 0 %
EOS (ABSOLUTE): 0 10*3/uL (ref 0.0–0.4)
EOS ABS: 0 10*3/uL (ref 0.0–0.7)
Eos: 0 %
Eosinophils Relative: 0 %
HEMATOCRIT: 31.1 % — AB (ref 39.0–52.0)
HEMOGLOBIN: 11.1 g/dL — AB (ref 12.6–17.7)
HEMOGLOBIN: 10.3 g/dL — AB (ref 13.0–17.0)
Hematocrit: 32.8 % — ABNORMAL LOW (ref 37.5–51.0)
Immature Grans (Abs): 0.2 10*3/uL — ABNORMAL HIGH (ref 0.0–0.1)
Immature Granulocytes: 2 %
LYMPHS ABS: 1.2 10*3/uL (ref 0.7–4.0)
Lymphocytes Absolute: 2.3 10*3/uL (ref 0.7–3.1)
Lymphocytes Relative: 20 %
Lymphs: 30 %
MCH: 30.9 pg (ref 26.6–33.0)
MCH: 29.7 pg (ref 26.0–34.0)
MCHC: 33.8 g/dL (ref 31.5–35.7)
MCHC: 33.1 g/dL (ref 30.0–36.0)
MCV: 91 fL (ref 79–97)
MCV: 89.6 fL (ref 78.0–100.0)
MONO ABS: 0.5 10*3/uL (ref 0.1–1.0)
MONOCYTES: 11 %
MONOS PCT: 9 %
Monocytes Absolute: 0.8 10*3/uL (ref 0.1–0.9)
NEUTROS ABS: 4.4 10*3/uL (ref 1.7–7.7)
Neutrophils Absolute: 4.3 10*3/uL (ref 1.4–7.0)
Neutrophils Relative %: 71 %
Neutrophils: 57 %
PLATELETS: 252 10*3/uL (ref 150–379)
Platelets: 218 10*3/uL (ref 150–400)
RBC: 3.59 x10E6/uL — AB (ref 4.14–5.80)
RBC: 3.47 MIL/uL — ABNORMAL LOW (ref 4.22–5.81)
RDW: 14 % (ref 12.3–15.4)
RDW: 13.3 % (ref 11.5–15.5)
WBC: 7.7 10*3/uL (ref 3.4–10.8)
WBC: 6.2 10*3/uL (ref 4.0–10.5)

## 2015-05-14 LAB — IRON AND TIBC
IRON: 56 ug/dL (ref 45–182)
SATURATION RATIOS: 29 % (ref 17.9–39.5)
TIBC: 196 ug/dL — AB (ref 250–450)
UIBC: 140 ug/dL

## 2015-05-14 LAB — URINE MICROSCOPIC-ADD ON

## 2015-05-14 LAB — COMPREHENSIVE METABOLIC PANEL
ALBUMIN: 2.6 g/dL — AB (ref 3.5–5.0)
ALK PHOS: 107 U/L (ref 38–126)
ALT: 22 U/L (ref 17–63)
AST: 98 U/L — ABNORMAL HIGH (ref 15–41)
Anion gap: 10 (ref 5–15)
BUN: 21 mg/dL — ABNORMAL HIGH (ref 6–20)
CALCIUM: 9.1 mg/dL (ref 8.9–10.3)
CHLORIDE: 98 mmol/L — AB (ref 101–111)
CO2: 26 mmol/L (ref 22–32)
CREATININE: 0.97 mg/dL (ref 0.61–1.24)
GFR calc Af Amer: >60 mL/min (ref >60)
GFR calc non Af Amer: >60 mL/min (ref >60)
GLUCOSE: 165 mg/dL — AB (ref 65–99)
Potassium: 3.5 mmol/L (ref 3.5–5.1)
SODIUM: 134 mmol/L — AB (ref 135–145)
Total Bilirubin: 1 mg/dL (ref 0.3–1.2)
Total Protein: 6.3 g/dL — ABNORMAL LOW (ref 6.5–8.1)

## 2015-05-14 LAB — RETICULOCYTES
RBC.: 3.47 MIL/uL — ABNORMAL LOW (ref 4.22–5.81)
Retic Count, Absolute: 55.5 10*3/uL (ref 19.0–186.0)
Retic Ct Pct: 1.6 % (ref 0.4–3.1)

## 2015-05-14 LAB — URINALYSIS, ROUTINE W REFLEX MICROSCOPIC
Bilirubin Urine: NEGATIVE
GLUCOSE, UA: NEGATIVE mg/dL
Ketones, ur: NEGATIVE mg/dL
Nitrite: POSITIVE — AB
PH: 6.5 (ref 5.0–8.0)
Protein, ur: 100 mg/dL — AB
Specific Gravity, Urine: 1.029 (ref 1.005–1.030)
Urobilinogen, UA: 1 mg/dL (ref 0.0–1.0)

## 2015-05-14 LAB — FERRITIN: Ferritin: 4528 ng/mL — ABNORMAL HIGH (ref 24–336)

## 2015-05-14 LAB — BMP8+EGFR
BUN/Creatinine Ratio: 24 — ABNORMAL HIGH (ref 10–22)
BUN: 23 mg/dL (ref 10–36)
CO2: 23 mmol/L (ref 18–29)
Calcium: 9.5 mg/dL (ref 8.6–10.2)
Chloride: 99 mmol/L (ref 97–106)
Creatinine, Ser: 0.94 mg/dL (ref 0.76–1.27)
GFR calc Af Amer: 81 mL/min/{1.73_m2} (ref >59)
GFR calc non Af Amer: 70 mL/min/{1.73_m2} (ref >59)
Glucose: 117 mg/dL — ABNORMAL HIGH (ref 65–99)
Potassium: 3.9 mmol/L (ref 3.5–5.2)
Sodium: 140 mmol/L (ref 136–144)

## 2015-05-14 LAB — VITAMIN B12: VITAMIN B 12: 1195 pg/mL — AB (ref 180–914)

## 2015-05-14 LAB — FOLATE: FOLATE: 21.4 ng/mL (ref >5.9)

## 2015-05-14 LAB — CK: CK TOTAL: 275 U/L (ref 49–397)

## 2015-05-14 MED ORDER — FERROUS FUMARATE 325 (106 FE) MG PO TABS
1.0000 | ORAL_TABLET | Freq: Every day | ORAL | Status: DC
  Administered 2015-05-14: 106 mg via ORAL
  Filled 2015-05-14 (×2): qty 1

## 2015-05-14 MED ORDER — BOOST PLUS PO LIQD
237.0000 mL | Freq: Three times a day (TID) | ORAL | Status: DC
  Administered 2015-05-14 – 2015-05-15 (×3): 237 mL via ORAL
  Filled 2015-05-14 (×10): qty 237

## 2015-05-14 MED ORDER — LEVOTHYROXINE SODIUM 50 MCG PO TABS
50.0000 ug | ORAL_TABLET | Freq: Every day | ORAL | Status: DC
  Administered 2015-05-14 – 2015-05-15 (×2): 50 ug via ORAL
  Filled 2015-05-14 (×3): qty 1

## 2015-05-14 MED ORDER — PANTOPRAZOLE SODIUM 40 MG PO TBEC
40.0000 mg | DELAYED_RELEASE_TABLET | Freq: Every day | ORAL | Status: DC
  Administered 2015-05-14 – 2015-05-16 (×3): 40 mg via ORAL
  Filled 2015-05-14 (×3): qty 1

## 2015-05-14 MED ORDER — INTEGRA PLUS PO CAPS: 1.0000 | ORAL_CAPSULE | Freq: Every day | ORAL | Status: DC

## 2015-05-14 MED ORDER — OMEGA-3-ACID ETHYL ESTERS 1 G PO CAPS
2.0000 g | ORAL_CAPSULE | Freq: Every day | ORAL | Status: DC
  Administered 2015-05-14 – 2015-05-16 (×3): 2 g via ORAL
  Filled 2015-05-14 (×6): qty 2

## 2015-05-14 MED ORDER — B COMPLEX-C PO TABS
1.0000 | ORAL_TABLET | Freq: Every day | ORAL | Status: DC
  Administered 2015-05-14 – 2015-05-16 (×3): 1 via ORAL
  Filled 2015-05-14 (×3): qty 1

## 2015-05-14 MED ORDER — PIPERACILLIN-TAZOBACTAM 3.375 G IVPB
3.3750 g | Freq: Three times a day (TID) | INTRAVENOUS | Status: DC
  Administered 2015-05-14 – 2015-05-15 (×5): 3.375 g via INTRAVENOUS
  Filled 2015-05-14 (×8): qty 50

## 2015-05-14 MED ORDER — POTASSIUM CHLORIDE CRYS ER 20 MEQ PO TBCR
40.0000 meq | EXTENDED_RELEASE_TABLET | Freq: Once | ORAL | Status: AC
  Administered 2015-05-14: 40 meq via ORAL
  Filled 2015-05-14: qty 2

## 2015-05-14 MED ORDER — DEXTROSE 5 % IV SOLN
1.0000 g | Freq: Once | INTRAVENOUS | Status: AC
  Administered 2015-05-14: 1 g via INTRAVENOUS
  Filled 2015-05-14: qty 10

## 2015-05-14 MED ORDER — ONDANSETRON HCL 4 MG PO TABS
4.0000 mg | ORAL_TABLET | Freq: Four times a day (QID) | ORAL | Status: DC | PRN
  Administered 2015-05-15: 4 mg via ORAL
  Filled 2015-05-14: qty 1

## 2015-05-14 MED ORDER — INFLUENZA VAC SPLIT QUAD 0.5 ML IM SUSY: 0.5000 mL | PREFILLED_SYRINGE | Freq: Once | INTRAMUSCULAR | Status: DC

## 2015-05-14 MED ORDER — SODIUM CHLORIDE 0.9 % IV SOLN: INTRAVENOUS | Status: AC

## 2015-05-14 MED ORDER — ENOXAPARIN SODIUM 40 MG/0.4ML ~~LOC~~ SOLN
40.0000 mg | SUBCUTANEOUS | Status: DC
  Administered 2015-05-14 – 2015-05-16 (×3): 40 mg via SUBCUTANEOUS
  Filled 2015-05-14 (×3): qty 0.4

## 2015-05-14 MED ORDER — MORPHINE SULFATE (PF) 2 MG/ML IV SOLN
1.0000 mg | INTRAVENOUS | Status: DC | PRN
  Administered 2015-05-14 (×2): 1 mg via INTRAVENOUS
  Filled 2015-05-14 (×2): qty 1

## 2015-05-14 MED ORDER — FINASTERIDE 5 MG PO TABS
5.0000 mg | ORAL_TABLET | Freq: Every day | ORAL | Status: DC
  Administered 2015-05-14 – 2015-05-16 (×3): 5 mg via ORAL
  Filled 2015-05-14 (×3): qty 1

## 2015-05-14 MED ORDER — PRAVASTATIN SODIUM 40 MG PO TABS
40.0000 mg | ORAL_TABLET | Freq: Every day | ORAL | Status: DC
  Administered 2015-05-14 – 2015-05-16 (×3): 40 mg via ORAL
  Filled 2015-05-14 (×3): qty 1

## 2015-05-14 MED ORDER — HYDRALAZINE HCL 20 MG/ML IJ SOLN: 10.0000 mg | INTRAMUSCULAR | Status: DC | PRN

## 2015-05-14 MED ORDER — POLYETHYLENE GLYCOL 3350 17 G PO PACK: 17.0000 g | PACK | Freq: Every day | ORAL | Status: DC | PRN

## 2015-05-14 MED ORDER — ONDANSETRON HCL 4 MG/2ML IJ SOLN
4.0000 mg | Freq: Four times a day (QID) | INTRAMUSCULAR | Status: DC | PRN
  Administered 2015-05-14: 4 mg via INTRAVENOUS
  Filled 2015-05-14: qty 2

## 2015-05-14 MED ORDER — HYDROMORPHONE HCL 1 MG/ML IJ SOLN
1.0000 mg | Freq: Once | INTRAMUSCULAR | Status: AC
  Administered 2015-05-14: 1 mg via INTRAVENOUS
  Filled 2015-05-14: qty 1

## 2015-05-14 MED ORDER — CALCIUM CARBONATE-VITAMIN D 500-200 MG-UNIT PO TABS
1.0000 | ORAL_TABLET | Freq: Every day | ORAL | Status: DC
  Administered 2015-05-14 – 2015-05-16 (×3): 1 via ORAL
  Filled 2015-05-14 (×6): qty 1

## 2015-05-14 MED ORDER — ACETAMINOPHEN 650 MG RE SUPP: 650.0000 mg | Freq: Four times a day (QID) | RECTAL | Status: DC | PRN

## 2015-05-14 MED ORDER — ADULT MULTIVITAMIN W/MINERALS CH
1.0000 | ORAL_TABLET | Freq: Every day | ORAL | Status: DC
  Administered 2015-05-14 – 2015-05-16 (×3): 1 via ORAL
  Filled 2015-05-14 (×3): qty 1

## 2015-05-14 MED ORDER — ENSURE ENLIVE PO LIQD
237.0000 mL | Freq: Two times a day (BID) | ORAL | Status: DC
  Administered 2015-05-14 (×2): 237 mL via ORAL

## 2015-05-14 MED ORDER — ACETAMINOPHEN 325 MG PO TABS: 650.0000 mg | ORAL_TABLET | Freq: Four times a day (QID) | ORAL | Status: DC | PRN

## 2015-05-14 MED ORDER — IOHEXOL 300 MG/ML  SOLN
100.0000 mL | Freq: Once | INTRAMUSCULAR | Status: AC | PRN
  Administered 2015-05-14: 100 mL via INTRAVENOUS

## 2015-05-14 MED ORDER — IOHEXOL 300 MG/ML  SOLN
25.0000 mL | Freq: Once | INTRAMUSCULAR | Status: DC | PRN
  Administered 2015-05-14: 25 mL via ORAL
  Filled 2015-05-14: qty 30

## 2015-05-14 NOTE — ED Notes (Signed)
[place], MD at bedside 

## 2015-05-14 NOTE — H&P (Signed)
[facility] History and Physical  Luke Carter UXL:[id number] DOB: 12-Apr-1921 DOA: 05/13/2015  Referring physician: Dr. Ladona Ridgel. PCP: Rudi Heap, MD  Specialists: None.  Chief Complaint: Fatigue and weakness.  HPI: Luke Carter is a 81 y.o. male with history of BPH and outlet obstruction with suprapubic catheter, hypothyroidism, hyperlipidemia and B-12 deficiency was brought to the ER to patient was having increasing weakness over the last 2-3 weeks with fatigue. Patient also has been having off-and-on abdominal pain mostly in the suprapubic area. Patient's daughter states that his abdominal pain worsened yesterday. Denies any nausea vomiting. Denies any diarrhea. In the ER CT abdomen and pelvis shows features concerning for cystitis. Patient states he has been feeling increasingly fatigued with poor appetite and has lost some weight recently. Patient has been admitted for further observation and treatment of his UTI/cystitis and physical therapy consult. Patient presently has no chest pain or shortness of breath though 3 days ago has had some left upper back pain. Troponins and EKG were unremarkable.   Review of Systems: As presented in the history of presenting illness, rest negative.  Past Medical History  Diagnosis Date  . Hyperlipidemia   . BPH (benign prostatic hyperplasia)   . GERD (gastroesophageal reflux disease)   . Chronic constipation   . Urinary incontinence   . Urinary retention   . Wears glasses   . Wears hearing aid     bilateral  . OA (osteoarthritis) of knee     bilateral   Past Surgical History  Procedure Laterality Date  . Vocal cord surgery with gortex graft  2000    [facility]  . Tonsillectomy  as child  . Knee surgery Left age 42  . Cysto N/A 10/11/2014    Procedure: CYSTOSCOPY, URETHERAL DILATION, PLACEMENT OF SUPRAPUBIC TUBE;  Surgeon: Barron Alvine, MD;  Location: [facility];  Service: Urology;  Laterality: N/A;   Social History:   reports that he has never smoked. He has never used smokeless tobacco. He reports that he does not drink alcohol or use illicit drugs. Where does patient live home. Can patient participate in ADLs? Not sure.  No Known Allergies  Family History:  Family History  Problem Relation Age of Onset  . Diabetes Mellitus II Sister       Prior to Admission medications   Medication Sig Start Date End Date Taking? Authorizing Provider  Calcium Carbonate-Vit D-Min 1200-1000 MG-UNIT CHEW Chew 1 tablet by mouth daily.   Yes Historical Provider, MD  Cyanocobalamin (B-12 COMPLIANCE INJECTION IJ) Inject as directed every 30 (thirty) days. Last injection [phone] approx   Yes Historical Provider, MD  FeFum-FePoly-FA-B Cmp-C-Biot (INTEGRA PLUS) CAPS Take 1 capsule by mouth daily. 04/19/15  Yes Ernestina Penna, MD  finasteride (PROSCAR) 5 MG tablet Take 5 mg by mouth daily.   Yes Historical Provider, MD  fish oil-omega-3 fatty acids 1000 MG capsule Take 2 g by mouth daily.   Yes Historical Provider, MD  Influenza vac split quadrivalent PF (FLUARIX) 0.5 ML injection Inject 0.5 mLs into the muscle once.   Yes Historical Provider, MD  levothyroxine (SYNTHROID, LEVOTHROID) 50 MCG tablet TAKE 1 TABLET MONDAY, WEDNESDAY AND FRIDAY AND 1 1/2 TABLETS ALL OTHER DAYS 05/03/15  Yes Ernestina Penna, MD  Multiple Vitamin (MULTIVITAMIN) tablet Take 1 tablet by mouth daily.   Yes Historical Provider, MD  omeprazole (PRILOSEC) 20 MG capsule Take 20 mg by mouth 2 (two) times daily.   Yes Historical Provider, MD  polyethylene  glycol (MIRALAX / GLYCOLAX) packet Take 17 g by mouth daily as needed for moderate constipation or severe constipation.   Yes Historical Provider, MD  pravastatin (PRAVACHOL) 40 MG tablet Take 40 mg by mouth daily.   Yes Historical Provider, MD  ciprofloxacin (CIPRO) 500 MG tablet Take 500 mg by mouth 2 (two) times daily. 05/02/15   Historical Provider, MD    Physical Exam: Filed Vitals:   05/14/15 0030  05/14/15 0205 05/14/15 0230 05/14/15 0305  BP: 172/79 167/75 175/83 166/74  Pulse: 82 84 86 90  Temp:    97.6 F (36.4 C)  TempSrc:    Oral  Resp: 31 24 25 17   SpO2: 97% 93% 93% 95%     General:  Moderately built and nourished.  Eyes: Anicteric no pallor.  ENT: No discharge from the ears eyes nose or more.  Neck: No mass felt. No JVD appreciated.  Cardiovascular: S1 and S2 heard.  Respiratory: No rhonchi or crepitations.  Abdomen: Mild suprapubic tenderness. Catheter seen.  Skin: No rash.  Musculoskeletal: No edema.  Psychiatric: Appears normal.  Neurologic: Alert awake oriented to time place and person. Moves all extremities. Has tremors of the right upper extremity.  Labs on Admission:  Basic Metabolic Panel:  Recent Labs Lab 05/07/15 1740 05/13/15 1945  NA 143 137  K 4.9 3.9  CL 101 104  CO2 23 23  GLUCOSE 141* 133*  BUN 21 23*  CREATININE 1.04 1.07  CALCIUM 9.7 9.4   Liver Function Tests:  Recent Labs Lab 05/13/15 2153  AST 124*  ALT 23  ALKPHOS 112  BILITOT 0.8  PROT 6.9  ALBUMIN 3.0*    Recent Labs Lab 05/13/15 2153  LIPASE 19*   No results for input(s): AMMONIA in the last 168 hours. CBC:  Recent Labs Lab 05/07/15 1740 05/13/15 1945  WBC 8.4 6.8  NEUTROABS 4.6  --   HGB  --  10.6*  HCT 33.4* 32.0*  MCV  --  90.7  PLT  --  235   Cardiac Enzymes: No results for input(s): CKTOTAL, CKMB, CKMBINDEX, TROPONINI in the last 168 hours.  BNP (last 3 results) No results for input(s): BNP in the last 8760 hours.  ProBNP (last 3 results) No results for input(s): PROBNP in the last 8760 hours.  CBG: No results for input(s): GLUCAP in the last 168 hours.  Radiological Exams on Admission: Dg Chest 2 View  05/13/2015  CLINICAL DATA:  Pain across chest for 3-4 weeks, loss of appetite EXAM: CHEST  2 VIEW COMPARISON:  Earlier exam of 05/13/2015 ; correlation CT abdomen/pelvis 02/15/2010 FINDINGS: Normal heart size and pulmonary  vascularity. Atherosclerotic calcification aorta. Chronic RIGHT anterior diaphragmatic hernia containing fat and bowel unchanged. Emphysematous changes with minimal central peribronchial thickening consistent with COPD. No pulmonary infiltrate, pleural effusion or pneumothorax. Calcified granuloma lower RIGHT chest stable. Bones demineralized. IMPRESSION: COPD changes. Chronic anterior RIGHT diaphragmatic hernia. Electronically Signed   By: Ulyses SouthwardMark  Boles M.D.   On: 05/13/2015 20:25   Dg Chest 2 View  05/13/2015  CLINICAL DATA:  Chest pain. EXAM: CHEST - 2 VIEW COMPARISON:  Two-view chest x-ray 04/12/2015.  CT chest 08/06/2009 FINDINGS: The heart size is normal. Atherosclerotic calcifications are present at the arch. Calcified lymph nodes are present. Emphysematous changes are again seen. A large anterior diaphragmatic hernia is present on the right. The lungs are otherwise clear apart from 2 right-sided granulomata. There is fusion of anterior osteophytes across multiple levels of the thoracic spine. IMPRESSION:  1. No acute cardiopulmonary disease or significant interval change. 2. Chronic anterior right diaphragmatic hernia. 3. Emphysema. 4. Calcified lymph nodes and right-sided granulomata compatible with prior granulomatous disease. 5. DISH of the thoracic spine. Electronically Signed   By: Marin Roberts M.D.   On: 05/13/2015 15:58   Ct Abdomen Pelvis W Contrast  05/14/2015  CLINICAL DATA:  Chronic lower abdominal pain and pubic pain. Dysuria. Initial encounter. EXAM: CT ABDOMEN AND PELVIS WITH CONTRAST TECHNIQUE: Multidetector CT imaging of the abdomen and pelvis was performed using the standard protocol following bolus administration of intravenous contrast. CONTRAST:  OMNIPAQUE IOHEXOL 300 MG/ML  SOLN COMPARISON:  CT of the abdomen and pelvis performed 02/15/2010 FINDINGS: The visualized lung bases are clear. Scattered calcified granulomata are noted within the liver and spleen, particularly  prominent in the spleen. A few tiny hypodensities within the left hepatic lobe are nonspecific but may reflect tiny cysts. A stone is noted at the base of the gallbladder. The gallbladder is otherwise unremarkable. The pancreas and adrenal glands are unremarkable. Scattered left renal cysts are noted. Bilateral extrarenal pelves are seen, with mildly prominent bilateral ureters. Mild nonspecific perinephric stranding is noted bilaterally. The right kidney is otherwise unremarkable. There is no evidence of hydronephrosis. No renal or ureteral stones are seen. There is herniation of a short segment of distal ileum into a small to moderate right inguinal hernia, without evidence for obstruction or strangulation. The remaining small bowel is grossly unremarkable. The stomach is within normal limits. No acute vascular abnormalities are seen. Relatively diffuse calcification is seen along the abdominal aorta. The appendix is normal in caliber and contains air, without evidence of appendicitis. There is elevation of the right hemidiaphragm, with interposition of the hepatic flexure of the colon anterior and superior to the liver. Scattered diverticulosis noted along the transverse, descending and sigmoid colon, without definite evidence of diverticulitis. Dense stool is noted within the rectum. The bladder is mildly distended, with a suprapubic catheter noted. Diffuse soft tissue inflammation about the bladder raises concern for cystitis. There is marked enlargement of the prostate, with diffuse nodularity, measuring 7.9 x 6.3 cm, suspicious for underlying malignancy. Prominent bilateral pelvic sidewall nodes are noted, measuring up to 1.6 cm in short axis. No inguinal lymphadenopathy is seen. No acute osseous abnormalities are identified. Facet disease is noted at the lower lumbar spine. IMPRESSION: 1. Diffuse soft tissue inflammation about the bladder raises concern for cystitis. 2. Marked enlargement of the prostate,  with diffuse nodularity, measuring 7.9 x 6.3 cm, raising suspicion for underlying malignancy. Prominent bilateral pelvic sidewall nodes noted, measuring up to 1.6 cm in short axis. 3. Cholelithiasis; gallbladder otherwise unremarkable in appearance. 4. Scattered left renal cysts noted. 5. Herniation of a short segment of distal ileum into a small to moderate right inguinal hernia, without evidence for obstruction or strangulation. 6. Relatively diffuse calcification along the abdominal aorta and its branches. 7. Scattered diverticulosis along the transverse, descending and sigmoid colon, without definite evidence of diverticulitis. Dense stool noted within the rectum. Electronically Signed   By: Roanna Raider M.D.   On: 05/14/2015 00:45    EKG: Independently reviewed. Normal sinus rhythm with nonspecific ST-T changes.  Assessment/Plan Principal Problem:   Generalized weakness Active Problems:   Hyperlipemia   Hypothyroid   BPH (benign prostatic hypertrophy)   Cystitis   Weakness   1. Generalized weakness and fatigue - cause not clear but could be from UTI. Patient is placed on empiric antibiotics. Check CK  levels TSH and anemia panel. Get physical therapy consult. 2. UTI with suprapubic catheter - patient still has some abdominal tenderness. I have placed patient on Zosyn. Follow urine cultures. If pain persists consult patient's urologist. 3. Hyperlipidemia - on statins. Check CK levels because of weakness. 4. Chronic anemia with history of B-12 deficiency - patient gets B-12 shots every month and next dose is due next week. Check anemia panel. 5. Hypothyroidism on Synthroid - due to weakness check TSH. 6. History of tremors.  I have reviewed patient's old chart some labs and personally reviewed patient's chest x-ray and EKG.   DVT Prophylaxis Lovenox.  Code Status: DO NOT RESUSCITATE.  Family Communication: Patient's wife and daughter.  Disposition Plan: Admit for observation.     KAKRAKANDY,ARSHAD N. [facility] Pager [phone].  If 7PM-7AM, please contact night-coverage [email or web address] Password [email or web address] 05/14/2015, 3:09 AM

## 2015-05-14 NOTE — Progress Notes (Signed)
ANTIBIOTIC CONSULT NOTE - INITIAL  Pharmacy Consult for Zosyn  Indication: Intra-abdominal infection  No Known Allergies   Vital Signs: Temp: 97.6 F (36.4 C) (10/18 0305) Temp Source: Oral (10/18 0305) BP: 166/74 mmHg (10/18 0305) Pulse Rate: 90 (10/18 0305)  Labs:  Recent Labs  05/13/15 1945  WBC 6.8  HGB 10.6*  PLT 235  CREATININE 1.07   Estimated Creatinine Clearance: 40.1 mL/min (by C-G formula based on Cr of 1.07). No results for input(s): VANCOTROUGH, VANCOPEAK, VANCORANDOM, GENTTROUGH, GENTPEAK, GENTRANDOM, TOBRATROUGH, TOBRAPEAK, TOBRARND, AMIKACINPEAK, AMIKACINTROU, AMIKACIN in the last 72 hours.   Microbiology: Recent Results (from the past 720 hour(s))  Urine culture     Status: None   Collection Time: 05/01/15  1:26 PM  Result Value Ref Range Status   Urine Culture, Routine Final report  Final   Urine Culture result 1 Comment  Final    Comment: Mixed urogenital flora Greater than 100,000 colony forming units per mL   Urine culture     Status: Abnormal   Collection Time: 05/07/15  5:40 PM  Result Value Ref Range Status   Urine Culture, Routine Final report (A)  Final   Urine Culture result 1 Yeast isolated. (A)  Final    Comment: Greater than 100,000 colony forming units per mL Request for further identification must be made within 1 week.    RESULT 2 Comment  Final    Comment: Mixed urogenital flora 50,000-100,000 colony forming units per mL     Medical History: Past Medical History  Diagnosis Date  . Hyperlipidemia   . BPH (benign prostatic hyperplasia)   . GERD (gastroesophageal reflux disease)   . Chronic constipation   . Urinary incontinence   . Urinary retention   . Wears glasses   . Wears hearing aid     bilateral  . OA (osteoarthritis) of knee     bilateral   Assessment: CT scan with possible cystitis and some cholelithiasis, WBC WNL, renal function good, starting to Zosyn to rule out intra-abdominal infection.   Plan:   -Zosyn 3.375G IV q8h to be infused over 4 hours -Trend WBC, temp, renal function  Luke Carter, Luke Carter 05/14/2015,3:19 AM

## 2015-05-14 NOTE — Progress Notes (Signed)
New Admission Note: Pt admitted to the unit from the [facility]  Arrival Method: Via Stretcher Mental Orientation: alert and oriented x 4 Telemetry: Non-applicable Assessment: Completed Skin: Intact IV: NS @ 75 Pain: Denies Tubes: None Safety Measures: Safety Fall Prevention Plan has been discussed Admission: To be completed [place] Orientation: Patient has been orientated to the room, unit and staff.  Family: None at bedside  Orders have been reviewed and implemented. Will continue to monitor the patient. Call light has been placed within reach and bed alarm has been activated.   Burley SaverKami Malyiah Fellows, RN-BC Phone: [phone]

## 2015-05-14 NOTE — Progress Notes (Signed)
[facility] PROGRESS NOTE   Luke Carter [id number] DOB: 10-21-1920 DOA: 05/13/2015 PCP: Rudi HeapMOORE, DONALD, MD  HPI/Subjective: Has significant suprapubic pain and tenderness.  Assessment/Plan: Principal Problem:   Generalized weakness Active Problems:   Hyperlipemia   Hypothyroid   BPH (benign prostatic hypertrophy)   Cystitis   Weakness   Patient seen and examined, data base reviewed. This is a no charge note, patient seen earlier today by my colleague Dr.Kakrakandy. Complicated UTI secondary to suprapubic catheter, on Zosyn.  Code Status: DNR Family Communication: Plan discussed with the patient. Disposition Plan: Remains inpatient Diet: Diet Heart Room service appropriate?: Yes; Fluid consistency:: Thin  Consultants:  None  Procedures:  None  Antibiotics:  Zosyn (indicate start date, and stop date if known)   Objective: Filed Vitals:   05/14/15 0544  BP: 144/68  Pulse: 85  Temp: 97.9 F (36.6 C)  Resp: 16    Intake/Output Summary (Last 24 hours) at 05/14/15 1151 Last data filed at 05/14/15 0700  Gross per 24 hour  Intake 323.75 ml  Output    100 ml  Net 223.75 ml   There were no vitals filed for this visit.  Exam: General: Alert and awake, oriented x3, not in any acute distress. HEENT: anicteric sclera, pupils reactive to light and accommodation, EOMI CVS: S1-S2 clear, no murmur rubs or gallops Chest: clear to auscultation bilaterally, no wheezing, rales or rhonchi Abdomen: soft nontender, nondistended, normal bowel sounds, no organomegaly Extremities: no cyanosis, clubbing or edema noted bilaterally Neuro: Cranial nerves II-XII intact, no focal neurological deficits  Data Reviewed: Basic Metabolic Panel:  Recent Labs Lab 05/07/15 1740 05/13/15 1501 05/13/15 1945 05/14/15 0401  NA 143 140 137 134*  K 4.9 3.9 3.9 3.5  CL 101 99 104 98*  CO2 23 23 23 26   GLUCOSE 141* 117* 133* 165*  BUN 21 23 23* 21*  CREATININE 1.04  0.94 1.07 0.97  CALCIUM 9.7 9.5 9.4 9.1   Liver Function Tests:  Recent Labs Lab 05/13/15 2153 05/14/15 0401  AST 124* 98*  ALT 23 22  ALKPHOS 112 107  BILITOT 0.8 1.0  PROT 6.9 6.3*  ALBUMIN 3.0* 2.6*    Recent Labs Lab 05/13/15 2153  LIPASE 19*   No results for input(s): AMMONIA in the last 168 hours. CBC:  Recent Labs Lab 05/07/15 1740 05/13/15 1501 05/13/15 1945 05/14/15 0401  WBC 8.4 7.7 6.8 6.2  NEUTROABS 4.6 4.3  --  4.4  HGB  --   --  10.6* 10.3*  HCT 33.4* 32.8* 32.0* 31.1*  MCV  --   --  90.7 89.6  PLT  --   --  235 218   Cardiac Enzymes:  Recent Labs Lab 05/14/15 0401  CKTOTAL 275   BNP (last 3 results) No results for input(s): BNP in the last 8760 hours.  ProBNP (last 3 results) No results for input(s): PROBNP in the last 8760 hours.  CBG: No results for input(s): GLUCAP in the last 168 hours.  Micro Recent Results (from the past 240 hour(s))  Urine culture     Status: Abnormal   Collection Time: 05/07/15  5:40 PM  Result Value Ref Range Status   Urine Culture, Routine Final report (A)  Final   Urine Culture result 1 Yeast isolated. (A)  Final    Comment: Greater than 100,000 colony forming units per mL Request for further identification must be made within 1 week.    RESULT 2 Comment  Final  Comment: Mixed urogenital flora 50,000-100,000 colony forming units per mL      Studies: Dg Chest 2 View  05/13/2015  CLINICAL DATA:  Pain across chest for 3-4 weeks, loss of appetite EXAM: CHEST  2 VIEW COMPARISON:  Earlier exam of 05/13/2015 ; correlation CT abdomen/pelvis 02/15/2010 FINDINGS: Normal heart size and pulmonary vascularity. Atherosclerotic calcification aorta. Chronic RIGHT anterior diaphragmatic hernia containing fat and bowel unchanged. Emphysematous changes with minimal central peribronchial thickening consistent with COPD. No pulmonary infiltrate, pleural effusion or pneumothorax. Calcified granuloma lower RIGHT chest  stable. Bones demineralized. IMPRESSION: COPD changes. Chronic anterior RIGHT diaphragmatic hernia. Electronically Signed   By: Ulyses Southward M.D.   On: 05/13/2015 20:25   Dg Chest 2 View  05/13/2015  CLINICAL DATA:  Chest pain. EXAM: CHEST - 2 VIEW COMPARISON:  Two-view chest x-ray 04/12/2015.  CT chest 08/06/2009 FINDINGS: The heart size is normal. Atherosclerotic calcifications are present at the arch. Calcified lymph nodes are present. Emphysematous changes are again seen. A large anterior diaphragmatic hernia is present on the right. The lungs are otherwise clear apart from 2 right-sided granulomata. There is fusion of anterior osteophytes across multiple levels of the thoracic spine. IMPRESSION: 1. No acute cardiopulmonary disease or significant interval change. 2. Chronic anterior right diaphragmatic hernia. 3. Emphysema. 4. Calcified lymph nodes and right-sided granulomata compatible with prior granulomatous disease. 5. DISH of the thoracic spine. Electronically Signed   By: Marin Roberts M.D.   On: 05/13/2015 15:58   Ct Abdomen Pelvis W Contrast  05/14/2015  CLINICAL DATA:  Chronic lower abdominal pain and pubic pain. Dysuria. Initial encounter. EXAM: CT ABDOMEN AND PELVIS WITH CONTRAST TECHNIQUE: Multidetector CT imaging of the abdomen and pelvis was performed using the standard protocol following bolus administration of intravenous contrast. CONTRAST:  OMNIPAQUE IOHEXOL 300 MG/ML  SOLN COMPARISON:  CT of the abdomen and pelvis performed 02/15/2010 FINDINGS: The visualized lung bases are clear. Scattered calcified granulomata are noted within the liver and spleen, particularly prominent in the spleen. A few tiny hypodensities within the left hepatic lobe are nonspecific but may reflect tiny cysts. A stone is noted at the base of the gallbladder. The gallbladder is otherwise unremarkable. The pancreas and adrenal glands are unremarkable. Scattered left renal cysts are noted. Bilateral  extrarenal pelves are seen, with mildly prominent bilateral ureters. Mild nonspecific perinephric stranding is noted bilaterally. The right kidney is otherwise unremarkable. There is no evidence of hydronephrosis. No renal or ureteral stones are seen. There is herniation of a short segment of distal ileum into a small to moderate right inguinal hernia, without evidence for obstruction or strangulation. The remaining small bowel is grossly unremarkable. The stomach is within normal limits. No acute vascular abnormalities are seen. Relatively diffuse calcification is seen along the abdominal aorta. The appendix is normal in caliber and contains air, without evidence of appendicitis. There is elevation of the right hemidiaphragm, with interposition of the hepatic flexure of the colon anterior and superior to the liver. Scattered diverticulosis noted along the transverse, descending and sigmoid colon, without definite evidence of diverticulitis. Dense stool is noted within the rectum. The bladder is mildly distended, with a suprapubic catheter noted. Diffuse soft tissue inflammation about the bladder raises concern for cystitis. There is marked enlargement of the prostate, with diffuse nodularity, measuring 7.9 x 6.3 cm, suspicious for underlying malignancy. Prominent bilateral pelvic sidewall nodes are noted, measuring up to 1.6 cm in short axis. No inguinal lymphadenopathy is seen. No acute osseous abnormalities  are identified. Facet disease is noted at the lower lumbar spine. IMPRESSION: 1. Diffuse soft tissue inflammation about the bladder raises concern for cystitis. 2. Marked enlargement of the prostate, with diffuse nodularity, measuring 7.9 x 6.3 cm, raising suspicion for underlying malignancy. Prominent bilateral pelvic sidewall nodes noted, measuring up to 1.6 cm in short axis. 3. Cholelithiasis; gallbladder otherwise unremarkable in appearance. 4. Scattered left renal cysts noted. 5. Herniation of a short  segment of distal ileum into a small to moderate right inguinal hernia, without evidence for obstruction or strangulation. 6. Relatively diffuse calcification along the abdominal aorta and its branches. 7. Scattered diverticulosis along the transverse, descending and sigmoid colon, without definite evidence of diverticulitis. Dense stool noted within the rectum. Electronically Signed   By: Roanna Raider M.D.   On: 05/14/2015 00:45    Scheduled Meds: . B-complex with vitamin C  1 tablet Oral Daily  . calcium-vitamin D  1 tablet Oral Daily  . enoxaparin (LOVENOX) injection  40 mg Subcutaneous Q24H  . feeding supplement (ENSURE ENLIVE)  237 mL Oral BID BM  . ferrous fumarate  1 tablet Oral Daily  . finasteride  5 mg Oral Daily  . levothyroxine  50 mcg Oral QAC breakfast  . multivitamin with minerals  1 tablet Oral Daily  . omega-3 acid ethyl esters  2 g Oral Daily  . pantoprazole  40 mg Oral Daily  . piperacillin-tazobactam (ZOSYN)  IV  3.375 g Intravenous 3 times per day  . potassium chloride  40 mEq Oral Once  . pravastatin  40 mg Oral Daily   Continuous Infusions: . sodium chloride 75 mL/hr at 05/14/15 0321       Time spent: 35 minutes    [facility]  [facility] Pager [phone] If 7PM-7AM, please contact night-coverage at [email or web address], password [facility] 05/14/2015, 11:51 AM

## 2015-05-14 NOTE — Progress Notes (Signed)
Initial Nutrition Assessment  DOCUMENTATION CODES:   Severe malnutrition in context of chronic illness  INTERVENTION:  Discontinue Ensure.  Provide Boost Plus po TID, each supplements provides 360 kcal and 14 grams of protein.   Provide nourishment snack (ordered).  Encourage adequate PO intake.   NUTRITION DIAGNOSIS:   Malnutrition related to chronic illness as evidenced by severe depletion of muscle mass, percent weight loss.  GOAL:   Patient will meet greater than or equal to 90% of their needs  MONITOR:   PO intake, Supplement acceptance, Weight trends, Labs, I & O's  REASON FOR ASSESSMENT:   Malnutrition Screening Tool    ASSESSMENT:   81 y.o. male with history of BPH and outlet obstruction with suprapubic catheter, hypothyroidism, hyperlipidemia and B-12 deficiency was brought to the ER to patient was having increasing weakness over the last 2-3 weeks with fatigue. Patient also has been having off-and-on abdominal pain mostly in the suprapubic area.  Family at bedside. Patient reports his appetite was just "ok". Meal completion this AM was <25%. Daughter reports pt usually eats well with at least 3 meals daily at home. She also reports pt has been having weight loss she says is associated with his current illness and not able to get around as much. Usual body weight reported to be ~180 lbs. Per Epic weight records, pt with a 8.4% weight loss in 1 month. Pt currently has Ensure ordered and has been consuming them, however reports they are "too sweet" for him. Pt requests Boost instead. RD to modify orders. Pt additionally requested nourishment snacks. RD to order. Pt was encouraged to eat his food at meals and to drink his supplements. Daughter reports she will provide Boost for patient at home.   Nutrition-Focused physical exam completed. Findings are moderate fat depletion, severe muscle depletion, and no edema.   Labs and medications reviewed.   Diet Order:  Diet  Heart Room service appropriate?: Yes; Fluid consistency:: Thin  Skin:  Reviewed, no issues  Last BM:  10/15  Height:   Ht Readings from Last 1 Encounters:  05/13/15 5' 8.5" (1.74 m)    Weight:   Wt Readings from Last 1 Encounters:  05/13/15 141 lb 12.8 oz (64.32 kg)    Ideal Body Weight:  71.36 kg  BMI:  Body Mass Index: 21.29 kg/(m^2)  Estimated Nutritional Needs:   Kcal:  1700-1900  Protein:  75-85 grams  Fluid:  1.7 - 1.9 L/day  EDUCATION NEEDS:   No education needs identified at this time  Roslyn SmilingStephanie Amedee Cerrone, MS, RD, LDN Pager # [phone] After hours/ weekend pager # [phone]

## 2015-05-14 NOTE — Progress Notes (Signed)
Paged Dr. Arthor CaptainElmahi, pt co abd pain 9/10, morphine ineffective.  May he have something else for pain.

## 2015-05-14 NOTE — Progress Notes (Signed)
PT Cancellation Note  Patient Details Name: Luke LouisRudolph R [place] MRN: [id number] DOB: May 01, 1921   Cancelled Treatment:    Reason Eval/Treat Not Completed: Pain limiting ability to participate. Pt in excruciating pain. RN deferred as she is calling MD for different pain meds. PT to return as able.   Marcene BrawnChadwell, Iram Lundberg Marie 05/14/2015, 8:50 AM   Lewis ShockAshly Aamya Orellana, PT, DPT Pager #: [phone] Office #: [phone]

## 2015-05-14 NOTE — Evaluation (Signed)
Physical Therapy Evaluation Patient Details Name: Luke Carter MRN: [id number] DOB: 14-Dec-1920 Today's Date: 05/14/2015   History of Present Illness  Luke Carter is a 81 y.o. male with history of BPH and outlet obstruction with suprapubic catheter, hypothyroidism, hyperlipidemia and B-12 deficiency was brought to the ER to patient was having increasing weakness over the last 2-3 weeks with fatigue. Patient also has been having off-and-on abdominal pain mostly in the suprapubic area.   Clinical Impression  Pt admitted with above. Pt indep and primary caregiver to spouse PTA. Pt now at increased falls risk even with RW when ambulating. Pt's daughter is in the process of trying to transition pt and spouse to a retirement community. She is calling to see if they have a place available sooner because she is aware the both require 24/7 assist upon d/c.    Follow Up Recommendations SNF;Supervision/Assistance - 24 hour    Equipment Recommendations  Rolling walker with 5" wheels    Recommendations for Other Services       Precautions / Restrictions Precautions Precautions: Fall Restrictions Weight Bearing Restrictions: No      Mobility  Bed Mobility Overal bed mobility: Needs Assistance Bed Mobility: Rolling;Sidelying to Sit Rolling: Min guard Sidelying to sit: Min guard       General bed mobility comments: HOB elevated, labored effort, definite use of UEs, increased time and use of bed rails  Transfers Overall transfer level: Needs assistance Equipment used: Rolling walker (2 wheeled) Transfers: Sit to/from Stand Sit to Stand: Min assist         General transfer comment: increased time, v/c's to push push up from bed, posted back of legs up on bed  Ambulation/Gait Ambulation/Gait assistance: Min guard Ambulation Distance (Feet): 100 Feet Assistive device: Rolling walker (2 wheeled);None Gait Pattern/deviations: Step-through pattern;Decreased stride  length;Shuffle Gait velocity: decreased Gait velocity interpretation: <1.8 ft/sec, indicative of risk for recurrent falls General Gait Details: initially began amb without AD however pt very unsteady and reaching for things to hold onto. give RW pt with increased stability but con't to be guarded and requiring minA for walker management as pt never use RW before.   Stairs            Wheelchair Mobility    Modified Rankin (Stroke Patients Only)       Balance Overall balance assessment: Needs assistance Sitting-balance support: Feet supported;No upper extremity supported Sitting balance-Leahy Scale: Poor Sitting balance - Comments: pt unable to balance to bring LE up to don socks   Standing balance support: No upper extremity supported Standing balance-Leahy Scale: Poor Standing balance comment: posterior lean, required external support to prevent fall                             Pertinent Vitals/Pain Pain Assessment: 0-10 Pain Score: 4  Pain Location: catheter area Pain Intervention(s): Monitored during session    Home Living Family/patient expects to be discharged to:: Skilled nursing facility Living Arrangements: Spouse/significant other               Additional Comments: pt lives with elderly wife who is blind and HOH. Daughter is trying to transition them into a retirement home and they are aware as their house is already sold    Prior Function Level of Independence: Independent         Comments: pt was driving, amb without AD and primary caregiver [facility]  Hand Dominance   Dominant Hand: Right    Extremity/Trunk Assessment   Upper Extremity Assessment: Generalized weakness           Lower Extremity Assessment: Generalized weakness      Cervical / Trunk Assessment: Kyphotic  Communication   Communication: HOH  Cognition Arousal/Alertness: Lethargic;Suspect due to medications (but easily awoken and can participate without  difficulty) Behavior During Therapy: [facility] for tasks assessed/performed Overall Cognitive Status: Within Functional Limits for tasks assessed                      General Comments      Exercises        Assessment/Plan    PT Assessment Patient needs continued PT services  PT Diagnosis Difficulty walking;Generalized weakness   PT Problem List Decreased strength;Decreased activity tolerance;Decreased balance;Decreased mobility  PT Treatment Interventions DME instruction;Gait training;Functional mobility training;Therapeutic activities;Therapeutic exercise;Balance training   PT Goals (Current goals can be found in the Care Plan section) Acute Rehab PT Goals Patient Stated Goal: to get stronger PT Goal Formulation: With patient Time For Goal Achievement: 05/21/15 Potential to Achieve Goals: Good    Frequency Min 3X/week   Barriers to discharge Decreased caregiver support pt is primary giver to wife    Co-evaluation               End of Session Equipment Utilized During Treatment: Gait belt Activity Tolerance: Patient tolerated treatment well Patient left: in chair;with call bell/phone within reach;with chair alarm set Nurse Communication: Mobility status         Time: 1610-9604 PT Time Calculation (min) (ACUTE ONLY): 31 min   Charges:   PT Evaluation $Initial PT Evaluation Tier I: 1 Procedure PT Treatments $Gait Training: 8-22 mins   PT G CodesMarcene Brawn 05/14/2015, 4:33 PM  Lewis Shock, PT, DPT Pager #: [phone] Office #: [phone]

## 2015-05-15 DIAGNOSIS — N390 Urinary tract infection, site not specified: Secondary | ICD-10-CM

## 2015-05-15 DIAGNOSIS — E43 Unspecified severe protein-calorie malnutrition: Secondary | ICD-10-CM

## 2015-05-15 DIAGNOSIS — R531 Weakness: Secondary | ICD-10-CM

## 2015-05-15 DIAGNOSIS — N3090 Cystitis, unspecified without hematuria: Secondary | ICD-10-CM

## 2015-05-15 LAB — BASIC METABOLIC PANEL
Anion gap: 10 (ref 5–15)
BUN: 21 mg/dL — AB (ref 6–20)
CHLORIDE: 104 mmol/L (ref 101–111)
CO2: 28 mmol/L (ref 22–32)
Calcium: 9.3 mg/dL (ref 8.9–10.3)
Creatinine, Ser: 1.02 mg/dL (ref 0.61–1.24)
GFR calc Af Amer: >60 mL/min (ref >60)
GFR calc non Af Amer: >60 mL/min (ref >60)
GLUCOSE: 128 mg/dL — AB (ref 65–99)
POTASSIUM: 3.9 mmol/L (ref 3.5–5.1)
Sodium: 142 mmol/L (ref 135–145)

## 2015-05-15 LAB — URINE CULTURE

## 2015-05-15 MED ORDER — POLYETHYLENE GLYCOL 3350 17 G PO PACK
17.0000 g | PACK | Freq: Every day | ORAL | Status: DC
  Administered 2015-05-15 – 2015-05-16 (×2): 17 g via ORAL
  Filled 2015-05-15 (×2): qty 1

## 2015-05-15 MED ORDER — SENNOSIDES-DOCUSATE SODIUM 8.6-50 MG PO TABS
1.0000 | ORAL_TABLET | Freq: Two times a day (BID) | ORAL | Status: DC
  Administered 2015-05-15 – 2015-05-16 (×3): 1 via ORAL
  Filled 2015-05-15 (×3): qty 1

## 2015-05-15 MED ORDER — DEXTROSE 5 % IV SOLN
1.0000 g | INTRAVENOUS | Status: DC
  Filled 2015-05-15 (×2): qty 10

## 2015-05-15 NOTE — Progress Notes (Addendum)
[facility] PROGRESS NOTE  Luke Carter UJW:[id number] DOB: [birth year] DOA: 05/13/2015 PCP: Rudi HeapMOORE, DONALD, MD  Brief narrative   81 year old male with BPH and bladder outlet obstruction with suprapubic catheter, hypothyroidism, presented with increasing weakness for past 2-3 weeks, with suprapubic pain. Also reported increased fatigue and poor appetite. Admitted for acute cystitis.   Assessment/Plan: Acute cystitis On  empiric Zosyn. Narrow to rocephin.  urine culture growing aerococcus.. Recently completed abx as outpt with cx growing yeast. Still has abdominal tenderness. Continue pain medications. Consult urology if symptoms persist.  Hyperlipidemia Continue statin. CK normal.  Chronic anemia History of B12 deficiency. Gets B12 shot every month. Levels are normal. Iron panel normal.  Hypothyroidism On Synthroid. Recent TSH normal.  Generalized weakness and poor by mouth intake PT evaluation recommends skilled nursing facility. Patient lives with his wife who is legally blind and takes care of her. Daughter agrees on patient going to skilled nursing facility.   Severe malnutrition  appreciate dietician evaluation. Added supplement    DVT prophylaxis: sq lovenox  Diet: regular   Code Status: DNR Family Communication: wife at bedside Disposition Plan: SNF as early as 10/20   Consultants:  none  Procedures:    Antibiotics: IV zosyn 10/18-10/19 IV rocephin 10/19--  HPI/Subjective: Patient seen and examined. Complains of not being helped by the staff in regards to his meals  Objective: Filed Vitals:   05/15/15 0929  BP: 159/72  Pulse: 78  Temp: 98.2 F (36.8 C)  Resp: 18    Intake/Output Summary (Last 24 hours) at 05/15/15 1441 Last data filed at 05/15/15 1242  Gross per 24 hour  Intake    510 ml  Output   2250 ml  Net  -1740 ml   Filed Weights   05/15/15 0445  Weight: 64.365 kg (141 lb 14.4 oz)    Exam:   General:  Elderly male  lying in bed appears fatigued, not in distress  HEENT: Moist oral mucosa, supple neck  Chest: Clear to auscultation bilaterally  CVS: Normal S1 and S2, no murmurs rub or gallop  GI: Soft, nondistended, suprapubic catheter in place, minimal tenderness to palpation, bowel sounds present  Musculoskeletal: Warm, no edema  CNS: Alert and oriented     Data Reviewed: Basic Metabolic Panel:  Recent Labs Lab 05/13/15 1501 05/13/15 1945 05/14/15 0401 05/15/15 0354  NA 140 137 134* 142  K 3.9 3.9 3.5 3.9  CL 99 104 98* 104  CO2 23 23 26 28   GLUCOSE 117* 133* 165* 128*  BUN 23 23* 21* 21*  CREATININE 0.94 1.07 0.97 1.02  CALCIUM 9.5 9.4 9.1 9.3   Liver Function Tests:  Recent Labs Lab 05/13/15 2153 05/14/15 0401  AST 124* 98*  ALT 23 22  ALKPHOS 112 107  BILITOT 0.8 1.0  PROT 6.9 6.3*  ALBUMIN 3.0* 2.6*    Recent Labs Lab 05/13/15 2153  LIPASE 19*   No results for input(s): AMMONIA in the last 168 hours. CBC:  Recent Labs Lab 05/13/15 1501 05/13/15 1945 05/14/15 0401  WBC 7.7 6.8 6.2  NEUTROABS 4.3  --  4.4  HGB  --  10.6* 10.3*  HCT 32.8* 32.0* 31.1*  MCV  --  90.7 89.6  PLT  --  235 218   Cardiac Enzymes:  Recent Labs Lab 05/14/15 0401  CKTOTAL 275   BNP (last 3 results) No results for input(s): BNP in the last 8760 hours.  ProBNP (last 3 results) No results for input(s): PROBNP in  the last 8760 hours.  CBG: No results for input(s): GLUCAP in the last 168 hours.  Recent Results (from the past 240 hour(s))  Urine culture     Status: Abnormal   Collection Time: 05/07/15  5:40 PM  Result Value Ref Range Status   Urine Culture, Routine Final report (A)  Final   Urine Culture result 1 Yeast isolated. (A)  Final    Comment: Greater than 100,000 colony forming units per mL Request for further identification must be made within 1 week.    RESULT 2 Comment  Final    Comment: Mixed urogenital flora 50,000-100,000 colony forming units per  mL   Urine culture     Status: None   Collection Time: 05/14/15  1:23 AM  Result Value Ref Range Status   Specimen Description URINE, RANDOM  Final   Special Requests NONE  Final   Culture >=100,000 COLONIES/mL AEROCOCCUS URINAE  Final   Report Status 05/15/2015 FINAL  Final     Studies: Dg Chest 2 View  05/13/2015  CLINICAL DATA:  Pain across chest for 3-4 weeks, loss of appetite EXAM: CHEST  2 VIEW COMPARISON:  Earlier exam of 05/13/2015 ; correlation CT abdomen/pelvis 02/15/2010 FINDINGS: Normal heart size and pulmonary vascularity. Atherosclerotic calcification aorta. Chronic RIGHT anterior diaphragmatic hernia containing fat and bowel unchanged. Emphysematous changes with minimal central peribronchial thickening consistent with COPD. No pulmonary infiltrate, pleural effusion or pneumothorax. Calcified granuloma lower RIGHT chest stable. Bones demineralized. IMPRESSION: COPD changes. Chronic anterior RIGHT diaphragmatic hernia. Electronically Signed   By: Ulyses Southward M.D.   On: 05/13/2015 20:25   Dg Chest 2 View  05/13/2015  CLINICAL DATA:  Chest pain. EXAM: CHEST - 2 VIEW COMPARISON:  Two-view chest x-ray 04/12/2015.  CT chest 08/06/2009 FINDINGS: The heart size is normal. Atherosclerotic calcifications are present at the arch. Calcified lymph nodes are present. Emphysematous changes are again seen. A large anterior diaphragmatic hernia is present on the right. The lungs are otherwise clear apart from 2 right-sided granulomata. There is fusion of anterior osteophytes across multiple levels of the thoracic spine. IMPRESSION: 1. No acute cardiopulmonary disease or significant interval change. 2. Chronic anterior right diaphragmatic hernia. 3. Emphysema. 4. Calcified lymph nodes and right-sided granulomata compatible with prior granulomatous disease. 5. DISH of the thoracic spine. Electronically Signed   By: Marin Roberts M.D.   On: 05/13/2015 15:58   Ct Abdomen Pelvis W  Contrast  05/14/2015  CLINICAL DATA:  Chronic lower abdominal pain and pubic pain. Dysuria. Initial encounter. EXAM: CT ABDOMEN AND PELVIS WITH CONTRAST TECHNIQUE: Multidetector CT imaging of the abdomen and pelvis was performed using the standard protocol following bolus administration of intravenous contrast. CONTRAST:  OMNIPAQUE IOHEXOL 300 MG/ML  SOLN COMPARISON:  CT of the abdomen and pelvis performed 02/15/2010 FINDINGS: The visualized lung bases are clear. Scattered calcified granulomata are noted within the liver and spleen, particularly prominent in the spleen. A few tiny hypodensities within the left hepatic lobe are nonspecific but may reflect tiny cysts. A stone is noted at the base of the gallbladder. The gallbladder is otherwise unremarkable. The pancreas and adrenal glands are unremarkable. Scattered left renal cysts are noted. Bilateral extrarenal pelves are seen, with mildly prominent bilateral ureters. Mild nonspecific perinephric stranding is noted bilaterally. The right kidney is otherwise unremarkable. There is no evidence of hydronephrosis. No renal or ureteral stones are seen. There is herniation of a short segment of distal ileum into a small to moderate right inguinal  hernia, without evidence for obstruction or strangulation. The remaining small bowel is grossly unremarkable. The stomach is within normal limits. No acute vascular abnormalities are seen. Relatively diffuse calcification is seen along the abdominal aorta. The appendix is normal in caliber and contains air, without evidence of appendicitis. There is elevation of the right hemidiaphragm, with interposition of the hepatic flexure of the colon anterior and superior to the liver. Scattered diverticulosis noted along the transverse, descending and sigmoid colon, without definite evidence of diverticulitis. Dense stool is noted within the rectum. The bladder is mildly distended, with a suprapubic catheter noted. Diffuse soft  tissue inflammation about the bladder raises concern for cystitis. There is marked enlargement of the prostate, with diffuse nodularity, measuring 7.9 x 6.3 cm, suspicious for underlying malignancy. Prominent bilateral pelvic sidewall nodes are noted, measuring up to 1.6 cm in short axis. No inguinal lymphadenopathy is seen. No acute osseous abnormalities are identified. Facet disease is noted at the lower lumbar spine. IMPRESSION: 1. Diffuse soft tissue inflammation about the bladder raises concern for cystitis. 2. Marked enlargement of the prostate, with diffuse nodularity, measuring 7.9 x 6.3 cm, raising suspicion for underlying malignancy. Prominent bilateral pelvic sidewall nodes noted, measuring up to 1.6 cm in short axis. 3. Cholelithiasis; gallbladder otherwise unremarkable in appearance. 4. Scattered left renal cysts noted. 5. Herniation of a short segment of distal ileum into a small to moderate right inguinal hernia, without evidence for obstruction or strangulation. 6. Relatively diffuse calcification along the abdominal aorta and its branches. 7. Scattered diverticulosis along the transverse, descending and sigmoid colon, without definite evidence of diverticulitis. Dense stool noted within the rectum. Electronically Signed   By: Roanna Raider M.D.   On: 05/14/2015 00:45    Scheduled Meds: . B-complex with vitamin C  1 tablet Oral Daily  . calcium-vitamin D  1 tablet Oral Daily  . enoxaparin (LOVENOX) injection  40 mg Subcutaneous Q24H  . finasteride  5 mg Oral Daily  . lactose free nutrition  237 mL Oral TID BM  . levothyroxine  50 mcg Oral QAC breakfast  . multivitamin with minerals  1 tablet Oral Daily  . omega-3 acid ethyl esters  2 g Oral Daily  . pantoprazole  40 mg Oral Daily  . piperacillin-tazobactam (ZOSYN)  IV  3.375 g Intravenous 3 times per day  . polyethylene glycol  17 g Oral Daily  . pravastatin  40 mg Oral Daily  . senna-docusate  1 tablet Oral BID   Continuous  Infusions:     Time spent: 25 minutes    Sanita Estrada  [facility] Pager [phone]. If 7PM-7AM, please contact night-coverage at [email or web address], password [facility] 05/15/2015, 2:41 PM  LOS: 1 day

## 2015-05-15 NOTE — Clinical Social Work Note (Signed)
Daughter talked with CSW regarding skilled facility placement. Patient and family in agreement with [facility] (full assessment to follow) and [facility] preferred.  CSW will initiate facility search.   Genelle BalVanessa Nikhil Osei, MSW, LCSW Licensed Clinical Social Worker Clinical Social Work Department [facility] [phone]

## 2015-05-16 DIAGNOSIS — N400 Enlarged prostate without lower urinary tract symptoms: Secondary | ICD-10-CM

## 2015-05-16 MED ORDER — BISACODYL 10 MG RE SUPP
10.0000 mg | Freq: Once | RECTAL | Status: AC
  Administered 2015-05-16: 10 mg via RECTAL
  Filled 2015-05-16: qty 1

## 2015-05-16 MED ORDER — SENNOSIDES-DOCUSATE SODIUM 8.6-50 MG PO TABS: 1.0000 | ORAL_TABLET | Freq: Two times a day (BID) | ORAL | Status: AC

## 2015-05-16 MED ORDER — CEFUROXIME AXETIL 500 MG PO TABS: 500.0000 mg | ORAL_TABLET | Freq: Two times a day (BID) | ORAL | Status: AC

## 2015-05-16 MED ORDER — BOOST PLUS PO LIQD: 237.0000 mL | Freq: Three times a day (TID) | ORAL | Status: AC

## 2015-05-16 NOTE — Discharge Summary (Addendum)
Physician Discharge Summary  Luke Carter GNF:[id number] DOB: 07/19/1921 DOA: 05/13/2015  PCP: Rudi Heap, MD  Admit date: 05/13/2015 Discharge date: 05/16/2015  Time spent: 35 minutes  Recommendations for Outpatient Follow-up:  1. Discharge to skilled nursing facility. She will complete a total ten-day course of antibiotics on 10/27.  Discharge Diagnoses:  Principal Problem:   UTI (lower urinary tract infection)  Active Problems:   Hyperlipemia   Hypothyroid   BPH (benign prostatic hypertrophy)   Cystitis   Generalized weakness   Weakness   Protein-calorie malnutrition, severe   Discharge Condition: Fair  Diet recommendation: Regular with supplement  Filed Weights   05/15/15 0445 05/15/15 2048  Weight: 64.365 kg (141 lb 14.4 oz) 64.048 kg (141 lb 3.2 oz)    History of present illness:  Easy for to admission H&P for details, in brief,81 year old male with BPH and bladder outlet obstruction with suprapubic catheter, hypothyroidism, presented with increasing weakness for past 2-3 weeks, with suprapubic pain. Also reported increased fatigue and poor appetite. Patient was recently treated as outpatient with a course of ciprofloxacin for UTI. On admission he was found to have acute cystitis and admitted for further management.  Hospital Course:  Acute cystitis Placed on empiric Zosyn. No urine culture sent on admission. urine culture growing aerococcus urinae.Marland Kitchen Recently completed abx as outpt with cx growing yeast. Will treat her with a course of Ceftin for total ten-day duration. Suprapubic tenderness has subsided. Has not required any pain medicine since yesterday.  Hyperlipidemia Continue statin. CK normal.  Chronic anemia History of B12 deficiency. Gets B12 shot every month. Levels are normal. Iron panel normal.  Hypothyroidism On Synthroid. Recent TSH normal.  Generalized weakness and poor by mouth intake PT evaluation recommends skilled nursing facility.  Patient lives with his wife who is legally blind and takes care of her. Daughter agrees on patient going to skilled nursing facility.   Severe malnutrition appreciate dietician evaluation. Added supplement.  Constipation Add a stool softener  Enlarged prostate with nodularity As seen on CT scan with suspicion for prostate CA. Patient to follow-up with urology as scheduled.  Bladder outlet obstruction with chronic suprapubic catheter Follows with urology. Continue finasteride.  Patient is clinically stable to be discharged to skilled nursing facility.    Code Status: DNR Family Communication: None at bedside today. Disposition Plan: SNF    Consultants:  none  Procedures:  CT abdomen and pelvis with contrast  Antibiotics: IV zosyn 10/18-10/19 IV rocephin 10/19--   Discharge Exam: Filed Vitals:   05/16/15 0945  BP: 132/64  Pulse: 77  Temp: 97.9 F (36.6 C)  Resp: 17    General: Elderly male not in distress  HEENT: Moist oral mucosa, supple neck  Chest: Clear to auscultation bilaterally  CVS: Normal S1 and S2, no murmurs rub or gallop  GI: Soft, nondistended, suprapubic catheter in place, nontender, bowel sounds present  Musculoskeletal: Warm, no edema  CNS: Alert and oriented  Discharge Instructions    Current Discharge Medication List    START taking these medications   Details  cefUROXime (CEFTIN) 500 MG tablet Take 1 tablet (500 mg total) by mouth 2 (two) times daily with a meal. Qty: 16 tablet, Refills: 0    lactose free nutrition (BOOST PLUS) LIQD Take 237 mLs by mouth 3 (three) times daily between meals. Qty: 90 Can, Refills: 0    senna-docusate (SENOKOT-S) 8.6-50 MG tablet Take 1 tablet by mouth 2 (two) times daily. Qty: 20 tablet, Refills: 0  CONTINUE these medications which have NOT CHANGED   Details  Calcium Carbonate-Vit D-Min 1200-1000 MG-UNIT CHEW Chew 1 tablet by mouth daily.    Cyanocobalamin (B-12 COMPLIANCE  INJECTION IJ) Inject as directed every 30 (thirty) days. Last injection [id number] approx    FeFum-FePoly-FA-B Cmp-C-Biot (INTEGRA PLUS) CAPS Take 1 capsule by mouth daily. Qty: 30 capsule, Refills: 3    finasteride (PROSCAR) 5 MG tablet Take 5 mg by mouth daily.    fish oil-omega-3 fatty acids 1000 MG capsule Take 2 g by mouth daily.    levothyroxine (SYNTHROID, LEVOTHROID) 50 MCG tablet TAKE 1 TABLET MONDAY, WEDNESDAY AND FRIDAY AND 1 1/2 TABLETS ALL OTHER DAYS Qty: 39 tablet, Refills: 11    Multiple Vitamin (MULTIVITAMIN) tablet Take 1 tablet by mouth daily.    omeprazole (PRILOSEC) 20 MG capsule Take 20 mg by mouth 2 (two) times daily.    polyethylene glycol (MIRALAX / GLYCOLAX) packet Take 17 g by mouth daily as needed for moderate constipation or severe constipation.    pravastatin (PRAVACHOL) 40 MG tablet Take 40 mg by mouth daily.      STOP taking these medications     ciprofloxacin (CIPRO) 500 MG tablet        No Known Allergies Follow-up Information    Please follow up.   Why:  follow up with MD at SNF       The results of significant diagnostics from this hospitalization (including imaging, microbiology, ancillary and laboratory) are listed below for reference.    Significant Diagnostic Studies: Dg Chest 2 View  05/13/2015  CLINICAL DATA:  Pain across chest for 3-4 weeks, loss of appetite EXAM: CHEST  2 VIEW COMPARISON:  Earlier exam of 05/13/2015 ; correlation CT abdomen/pelvis 02/15/2010 FINDINGS: Normal heart size and pulmonary vascularity. Atherosclerotic calcification aorta. Chronic RIGHT anterior diaphragmatic hernia containing fat and bowel unchanged. Emphysematous changes with minimal central peribronchial thickening consistent with COPD. No pulmonary infiltrate, pleural effusion or pneumothorax. Calcified granuloma lower RIGHT chest stable. Bones demineralized. IMPRESSION: COPD changes. Chronic anterior RIGHT diaphragmatic hernia. Electronically Signed    By: Ulyses Southward M.D.   On: 05/13/2015 20:25   Dg Chest 2 View  05/13/2015  CLINICAL DATA:  Chest pain. EXAM: CHEST - 2 VIEW COMPARISON:  Two-view chest x-ray 04/12/2015.  CT chest 08/06/2009 FINDINGS: The heart size is normal. Atherosclerotic calcifications are present at the arch. Calcified lymph nodes are present. Emphysematous changes are again seen. A large anterior diaphragmatic hernia is present on the right. The lungs are otherwise clear apart from 2 right-sided granulomata. There is fusion of anterior osteophytes across multiple levels of the thoracic spine. IMPRESSION: 1. No acute cardiopulmonary disease or significant interval change. 2. Chronic anterior right diaphragmatic hernia. 3. Emphysema. 4. Calcified lymph nodes and right-sided granulomata compatible with prior granulomatous disease. 5. DISH of the thoracic spine. Electronically Signed   By: Marin Roberts M.D.   On: 05/13/2015 15:58   Dg Thoracic Spine 2 View  04/22/2015  CLINICAL DATA:  Ten days of back pain EXAM: THORACIC SPINE 2 VIEWS COMPARISON:  PA and lateral chest x-ray of April 12, 2015 FINDINGS: The thoracic vertebral bodies are preserved in height there is diffuse osteopenia. There is calcification of the anterior longitudinal ligament. The disc space heights are well maintained. The pedicles appear grossly intact. There are no abnormal paravertebral soft tissue densities. IMPRESSION: There is no acute compression fracture nor significant degenerative disc disease nor other acute abnormality of the thoracic spine. Electronically Signed  By: David  SwazilandJordan M.D.   On: 04/22/2015 13:00   Ct Abdomen Pelvis W Contrast  05/14/2015  CLINICAL DATA:  Chronic lower abdominal pain and pubic pain. Dysuria. Initial encounter. EXAM: CT ABDOMEN AND PELVIS WITH CONTRAST TECHNIQUE: Multidetector CT imaging of the abdomen and pelvis was performed using the standard protocol following bolus administration of intravenous contrast.  CONTRAST:  100mL OMNIPAQUE IOHEXOL 300 MG/ML  SOLN COMPARISON:  CT of the abdomen and pelvis performed 02/15/2010 FINDINGS: The visualized lung bases are clear. Scattered calcified granulomata are noted within the liver and spleen, particularly prominent in the spleen. A few tiny hypodensities within the left hepatic lobe are nonspecific but may reflect tiny cysts. A stone is noted at the base of the gallbladder. The gallbladder is otherwise unremarkable. The pancreas and adrenal glands are unremarkable. Scattered left renal cysts are noted. Bilateral extrarenal pelves are seen, with mildly prominent bilateral ureters. Mild nonspecific perinephric stranding is noted bilaterally. The right kidney is otherwise unremarkable. There is no evidence of hydronephrosis. No renal or ureteral stones are seen. There is herniation of a short segment of distal ileum into a small to moderate right inguinal hernia, without evidence for obstruction or strangulation. The remaining small bowel is grossly unremarkable. The stomach is within normal limits. No acute vascular abnormalities are seen. Relatively diffuse calcification is seen along the abdominal aorta. The appendix is normal in caliber and contains air, without evidence of appendicitis. There is elevation of the right hemidiaphragm, with interposition of the hepatic flexure of the colon anterior and superior to the liver. Scattered diverticulosis noted along the transverse, descending and sigmoid colon, without definite evidence of diverticulitis. Dense stool is noted within the rectum. The bladder is mildly distended, with a suprapubic catheter noted. Diffuse soft tissue inflammation about the bladder raises concern for cystitis. There is marked enlargement of the prostate, with diffuse nodularity, measuring 7.9 x 6.3 cm, suspicious for underlying malignancy. Prominent bilateral pelvic sidewall nodes are noted, measuring up to 1.6 cm in short axis. No inguinal  lymphadenopathy is seen. No acute osseous abnormalities are identified. Facet disease is noted at the lower lumbar spine. IMPRESSION: 1. Diffuse soft tissue inflammation about the bladder raises concern for cystitis. 2. Marked enlargement of the prostate, with diffuse nodularity, measuring 7.9 x 6.3 cm, raising suspicion for underlying malignancy. Prominent bilateral pelvic sidewall nodes noted, measuring up to 1.6 cm in short axis. 3. Cholelithiasis; gallbladder otherwise unremarkable in appearance. 4. Scattered left renal cysts noted. 5. Herniation of a short segment of distal ileum into a small to moderate right inguinal hernia, without evidence for obstruction or strangulation. 6. Relatively diffuse calcification along the abdominal aorta and its branches. 7. Scattered diverticulosis along the transverse, descending and sigmoid colon, without definite evidence of diverticulitis. Dense stool noted within the rectum. Electronically Signed   By: Roanna RaiderJeffery  Chang M.D.   On: 05/14/2015 00:45    Microbiology: Recent Results (from the past 240 hour(s))  Urine culture     Status: Abnormal   Collection Time: 05/07/15  5:40 PM  Result Value Ref Range Status   Urine Culture, Routine Final report (A)  Final   Urine Culture result 1 Yeast isolated. (A)  Final    Comment: Greater than 100,000 colony forming units per mL Request for further identification must be made within 1 week.    RESULT 2 Comment  Final    Comment: Mixed urogenital flora 50,000-100,000 colony forming units per mL   Urine culture  Status: None   Collection Time: 05/14/15  1:23 AM  Result Value Ref Range Status   Specimen Description URINE, RANDOM  Final   Special Requests NONE  Final   Culture >=100,000 COLONIES/mL AEROCOCCUS URINAE  Final   Report Status 05/15/2015 FINAL  Final     Labs: Basic Metabolic Panel:  Recent Labs Lab 05/13/15 1501 05/13/15 1945 05/14/15 0401 05/15/15 0354  NA 140 137 134* 142  K 3.9 3.9 3.5  3.9  CL 99 104 98* 104  CO2 23 23 26 28   GLUCOSE 117* 133* 165* 128*  BUN 23 23* 21* 21*  CREATININE 0.94 1.07 0.97 1.02  CALCIUM 9.5 9.4 9.1 9.3   Liver Function Tests:  Recent Labs Lab 05/13/15 2153 05/14/15 0401  AST 124* 98*  ALT 23 22  ALKPHOS 112 107  BILITOT 0.8 1.0  PROT 6.9 6.3*  ALBUMIN 3.0* 2.6*    Recent Labs Lab 05/13/15 2153  LIPASE 19*   No results for input(s): AMMONIA in the last 168 hours. CBC:  Recent Labs Lab 05/13/15 1501 05/13/15 1945 05/14/15 0401  WBC 7.7 6.8 6.2  NEUTROABS 4.3  --  4.4  HGB  --  10.6* 10.3*  HCT 32.8* 32.0* 31.1*  MCV  --  90.7 89.6  PLT  --  235 218   Cardiac Enzymes:  Recent Labs Lab 05/14/15 0401  CKTOTAL 275   BNP: BNP (last 3 results) No results for input(s): BNP in the last 8760 hours.  ProBNP (last 3 results) No results for input(s): PROBNP in the last 8760 hours.  CBG: No results for input(s): GLUCAP in the last 168 hours.     SignedEddie North  [facility] 05/16/2015, 1:32 PM

## 2015-05-16 NOTE — Progress Notes (Signed)
Physical Therapy Treatment Patient Details Name: Luke Carter MRN: [id number] DOB: 1921/07/05 Today's Date: 05/16/2015    History of Present Illness Luke LouisRudolph R Goodell is a 81 y.o. male with history of BPH and outlet obstruction with suprapubic catheter, hypothyroidism, hyperlipidemia and B-12 deficiency was brought to the ER to patient was having increasing weakness over the last 2-3 weeks with fatigue. Patient also has been having off-and-on abdominal pain mostly in the suprapubic area.     PT Comments    Progressing well towards functional goals. Still requires min assist for bed mobility and safe ambulation. Using a rolling walker for support. Motivated to improve his independence again. Patient will continue to benefit from skilled physical therapy services at SNF to further improve independence with functional mobility.   Follow Up Recommendations  SNF;Supervision/Assistance - 24 hour     Equipment Recommendations  Rolling walker with 5" wheels    Recommendations for Other Services       Precautions / Restrictions Precautions Precautions: Fall Restrictions Weight Bearing Restrictions: No    Mobility  Bed Mobility Overal bed mobility: Needs Assistance Bed Mobility: Supine to Sit;Sit to Supine     Supine to sit: Min assist;HOB elevated Sit to supine: Min assist   General bed mobility comments: Min assist for truncal support to rise to EOB with use of rail, and min assist for LE support back into bed. Cues for technique throughout  Transfers Overall transfer level: Needs assistance Equipment used: Rolling walker (2 wheeled) Transfers: Sit to/from Stand Sit to Stand: Min assist         General transfer comment: Min assist for balance from low bed setting, min guard from toilet with use of rail. Cues for technique and safety.  Ambulation/Gait Ambulation/Gait assistance: Min assist Ambulation Distance (Feet): 160 Feet Assistive device: Rolling walker (2  wheeled) Gait Pattern/deviations: Step-through pattern;Decreased stride length;Shuffle;Narrow base of support;Trunk flexed Gait velocity: slow Gait velocity interpretation: <1.8 ft/sec, indicative of risk for recurrent falls General Gait Details: Initially required min assist for balance with moderate lean towards left and difficulty keeping RW on floor. Balance improved with greater distance however needed min assist on occasion for obstical avoidance with use of RW. Cues for upright posture intermittently.   Stairs            Wheelchair Mobility    Modified Rankin (Stroke Patients Only)       Balance                                    Cognition Arousal/Alertness: Awake/alert Behavior During Therapy: WFL for tasks assessed/performed Overall Cognitive Status: Within Functional Limits for tasks assessed                      Exercises      General Comments General comments (skin integrity, edema, etc.): Pt was able to have a bowel movement, RN notified.      Pertinent Vitals/Pain Pain Assessment: No/denies pain Pain Intervention(s): Monitored during session    Home Living                      Prior Function            PT Goals (current goals can now be found in the care plan section) Acute Rehab PT Goals Patient Stated Goal: to get stronger PT Goal Formulation: With patient Time For Goal Achievement:  05/21/15 Potential to Achieve Goals: Good Progress towards PT goals: Progressing toward goals    Frequency  Min 3X/week    PT Plan Current plan remains appropriate    Co-evaluation             End of Session Equipment Utilized During Treatment: Gait belt Activity Tolerance: Patient tolerated treatment well Patient left: with call bell/phone within reach;in bed;with family/visitor present     Time: 9604-5409 PT Time Calculation (min) (ACUTE ONLY): 22 min  Charges:  $Gait Training: 8-22 mins                    G  Codes:      Berton Mount 31-May-2015, 5:09 PM Sunday [place], [place] [phone]

## 2015-05-16 NOTE — Clinical Social Work Placement (Signed)
   CLINICAL SOCIAL WORK PLACEMENT  NOTE  Date:  05/16/2015  Patient Details  Name: Luke Carter MRN: [id number] Date of Birth: May 11, 1921  Clinical Social Work is seeking post-discharge placement for this patient at the Skilled  Nursing Facility level of care (*CSW will initial, date and re-position this form in  chart as items are completed):  No (Family already had decided on a skilled facility for patient and made contact with the facility owner.)   Patient/family provided with [facility] Clinical [facility] Work Department's list of facilities offering this level of care within the geographic area requested by the patient (or if unable, by the patient's family).  Yes   Patient/family informed of their freedom to choose among providers that offer the needed level of care, that participate in Medicare, Medicaid or managed care program needed by the patient, have an available bed and are willing to accept the patient.  No   Patient/family informed of [facility]'s ownership interest in [facility] and [facility], as well as of the fact that they are under no obligation to receive care at these facilities.  PASRR submitted to EDS on 05/15/15     PASRR number received on 05/15/15     Existing PASRR number confirmed on       FL2 transmitted to all facilities in geographic area requested by pt/family on 05/15/15     FL2 transmitted to all facilities within larger geographic area on       Patient informed that his/her managed care company has contracts with or will negotiate with certain facilities, including the following:        Yes   Patient/family informed of bed offers received.  [facility]     Physician recommends and patient chooses bed at      [facility] 05[facility].  Patient to be transferred to facility by [facility])     Patient family notified on 05/16/15 of transfer.  Name of family  member notified:  Daughter [name] at the hospital.     PHYSICIAN       Additional Comment:    _______________________________________________ Cristobal Goldmannrawford, Ryleah Miramontes Bradley, LCSW 05/16/2015, 7:20 PM

## 2015-05-16 NOTE — Progress Notes (Signed)
Pt had bmx2. Family at bedside. Pt tolerates diet. No complaints of pain. Anticipate d/c to [facility] this eve.

## 2015-05-16 NOTE — Clinical Social Work Note (Signed)
Clinical Social Work Assessment  Patient Details  Name: Luke Carter MRN: [id number] Date of Birth: 10-17-1920  Date of referral:  05/15/15               Reason for consult:  Facility Placement                Permission sought to share information with:  Family Supports Permission granted to share information::  Yes, Verbal Permission Granted  Name::     Luke Carter  Agency::     Relationship::  Daughter  Contact Information:  [phone]  Housing/Transportation Living arrangements for the past 2 months:  Single Family Home Source of Information:  Spouse, Adult Children Patient Interpreter Needed:  None Criminal Activity/Legal Involvement Pertinent to Current Situation/Hospitalization:  No - Comment as needed Significant Relationships:  Adult Children, Spouse, Other Family Members Lives with:    Do you feel safe going back to the place where you live?  Yes (Patient feels safe at home, but understands that rehab is needed before going home.) Need for family participation in patient care:  Yes (Comment)  Care giving concerns:  None expressed by daughter or wife.   Social Worker assessment / plan:  CSW talked with patient's daughter and Luke Carter at the bedside on 05/16/15 regarding discharge planning. CSW had spoken briefly with daughter Luke Carter regarding discharge to a skilled facility and [facility] as the preference.  Luke Carter informed CSW that she and her husband had decided to leave their home (they have sold their home, but have until Feb. 2017 before the new owners will take possession) and [facility]  The want assisted living, but may have to be in independent living until an assisted room becomes available for herself and her husband.  They have been talking with the owner Brett CanalesSteve about moving there and regarding ST rehab for patient and was informed that a bed is available for patient.   Employment status:  Retired Product/process development scientistnsurance information:  Managed  Medicare PT Recommendations:  Skilled Nursing Facility Information / Referral to community resources:  Skilled Nursing Facility  Patient/Family's Response to care:  No concerns expressed regarding patient's care during hospitalization.  Patient/Family's Understanding of and Emotional Response to Diagnosis, Current Treatment, and Prognosis:  Not discussed.  Emotional Assessment Appearance:  Appears younger than stated age Attitude/Demeanor/Rapport:  Other (Appropriate) Affect (typically observed):  Calm, Appropriate Orientation:  Oriented to Self, Oriented to Place, Oriented to Situation Alcohol / Substance use:  Tobacco Use (Patient reported that he has never smoked and does not drink or use illicit drugs.) Psych involvement (Current and /or in the community):  No (Comment)  Discharge Needs  Concerns to be addressed:  Discharge Planning Concerns Readmission within the last 30 days:  No Current discharge risk:  None Barriers to Discharge:  No Barriers Identified   Cristobal GoldmannCrawford, Haya Hemler Bradley, LCSW 05/16/2015, 7:13 PM

## 2015-05-20 ENCOUNTER — Ambulatory Visit: Payer: Medicare Other

## 2015-07-28 DEATH — deceased

## 2015-08-22 ENCOUNTER — Ambulatory Visit: Payer: Medicare Other | Admitting: Family Medicine

## 2015-10-22 IMAGING — CR DG LUMBAR SPINE 2-3V
2 series · 2 of 2 positions shown · non-contrast
Comparison: 02/15/2010

CLINICAL DATA: Low back pain.  Right leg pain.

EXAM:
LUMBAR SPINE - 2-3 VIEW

[view not recorded (1 of 2)]
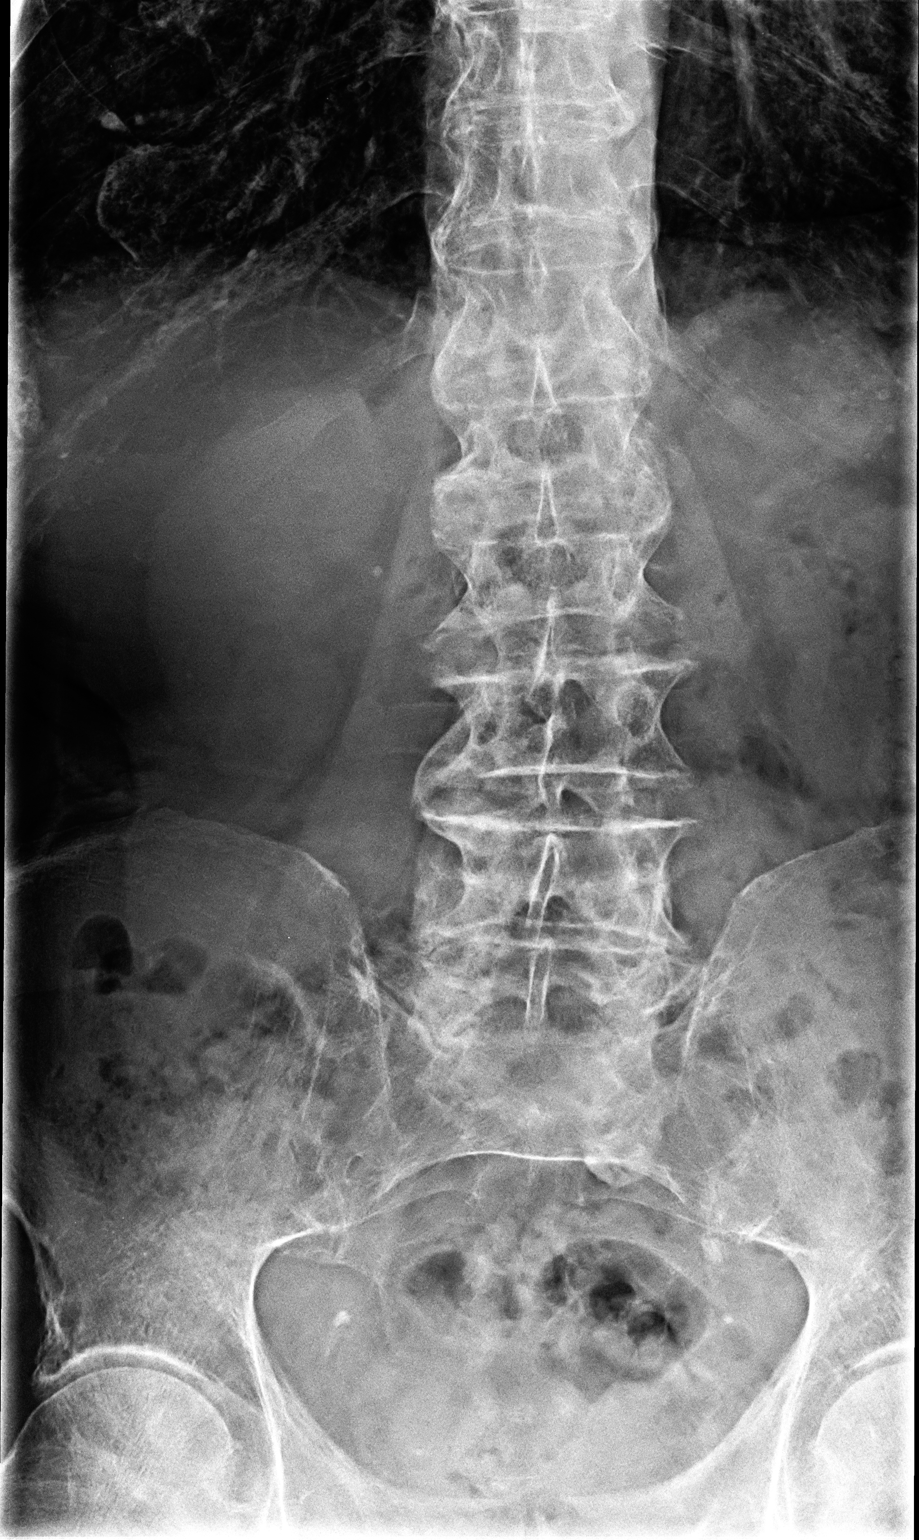

[view not recorded (2 of 2)]
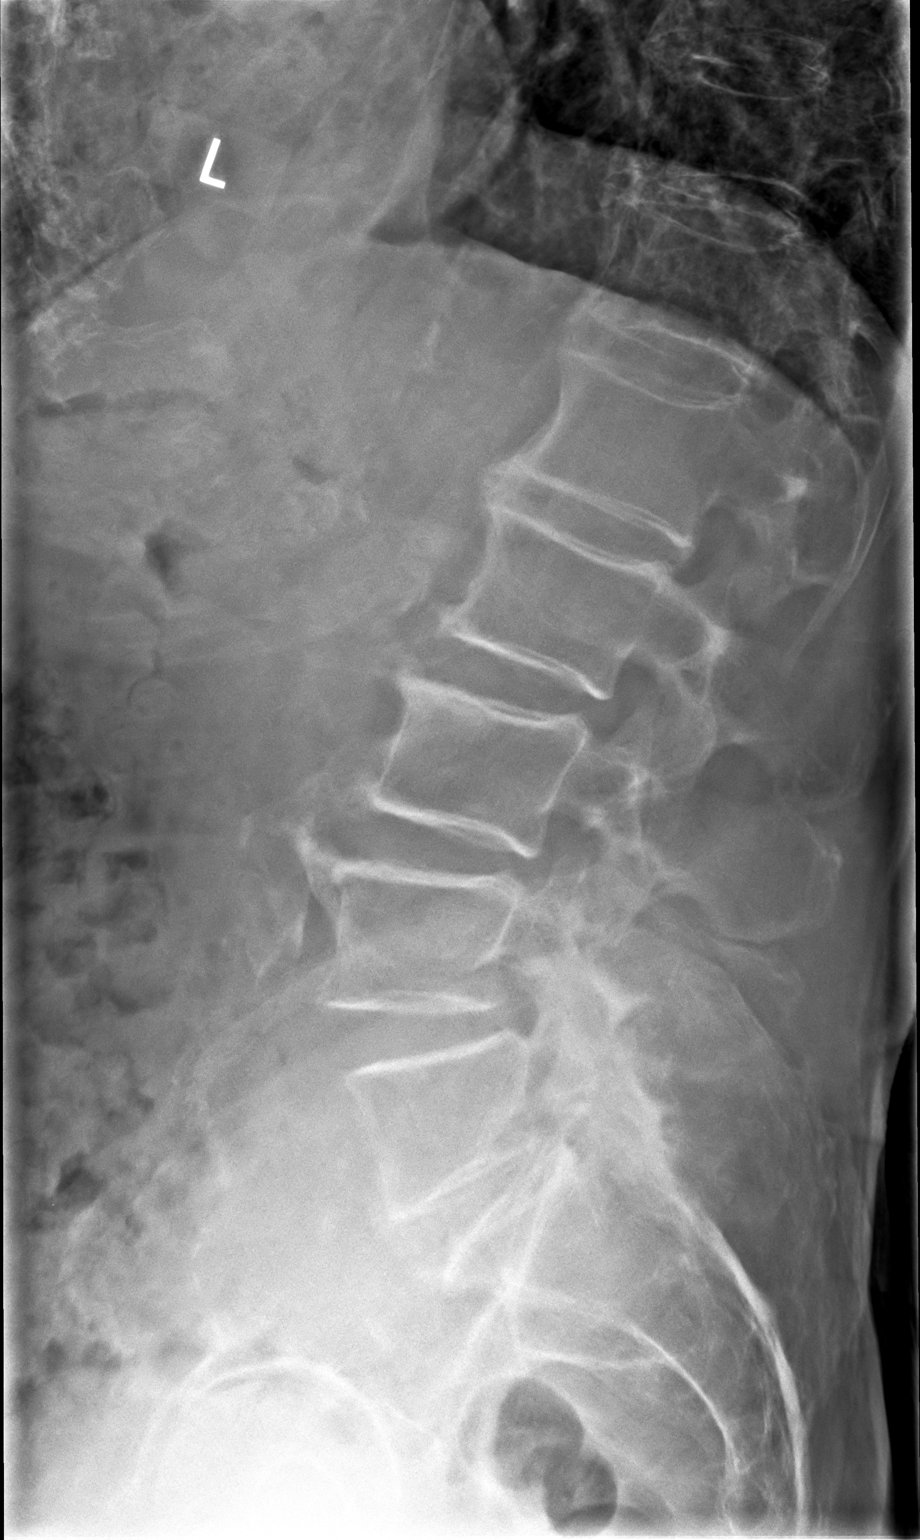

[2 of 2 positions shown; findings below may reference images not displayed]

FINDINGS: Slight anterolisthesis L4 upon L5 is stable. No vertebral
compression deformity. Disc height is relatively maintained. Facet
arthropathy at L4-5 and L5-S1. No definite fracture. Calcification
projects over the right renal pelvis. Gallstone is suspected.
IMPRESSION: No acute bony pathology.

Possible right nephrolithiasis.

## 2015-11-01 IMAGING — CR DG THORACIC SPINE 2V
2 series · 2 of 2 positions shown · non-contrast
Comparison: PA and lateral chest x-ray April 12, 2015

CLINICAL DATA: Ten days of back pain

EXAM:
THORACIC SPINE 2 VIEWS

[view not recorded (1 of 2)]
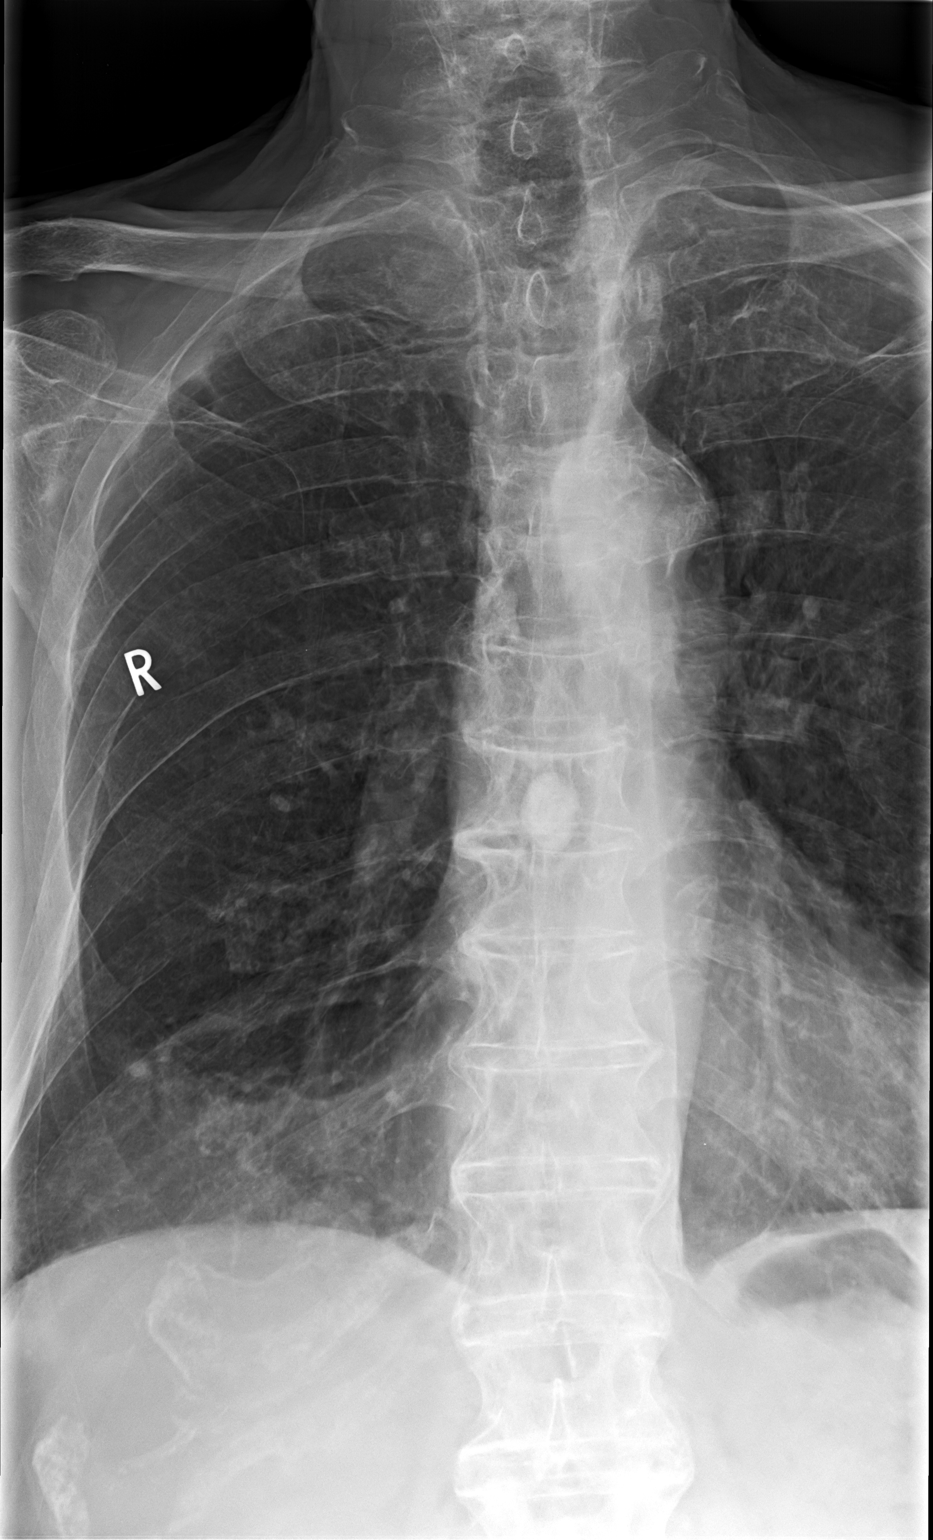

[view not recorded (2 of 2)]
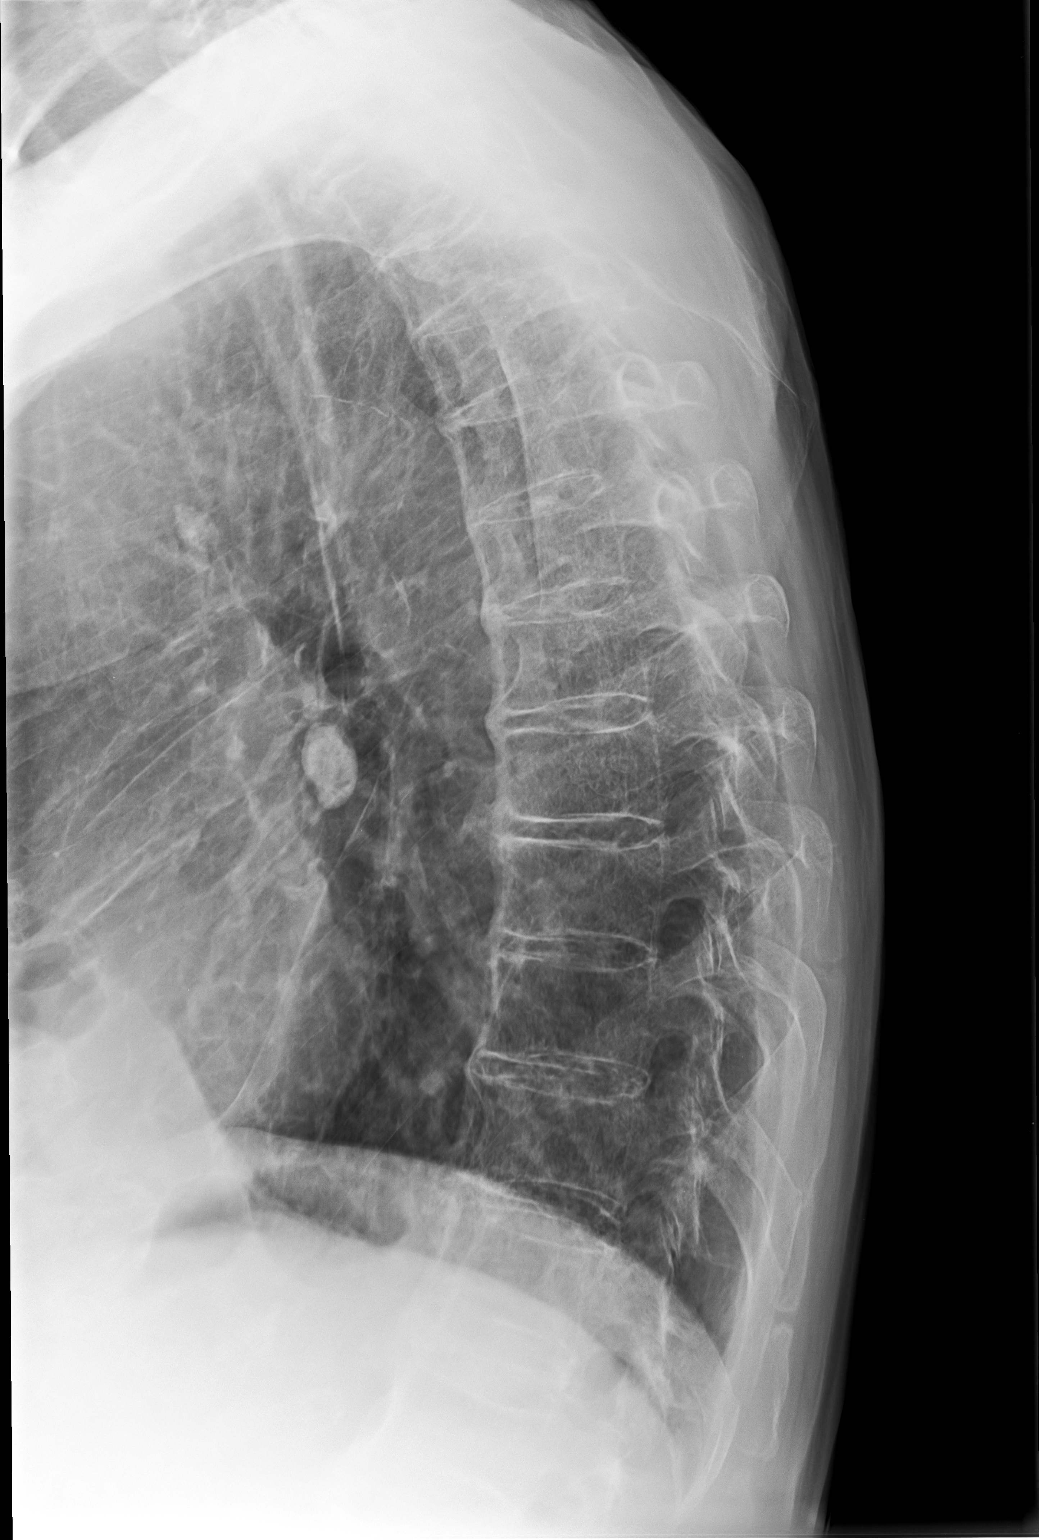

[2 of 2 positions shown; findings below may reference images not displayed]

FINDINGS: The thoracic vertebral bodies are preserved in height there is
diffuse osteopenia. There is calcification of the anterior
longitudinal ligament. The disc space heights are well maintained.
The pedicles appear grossly intact. There are no abnormal
paravertebral soft tissue densities.
IMPRESSION: There is no acute compression fracture nor significant degenerative
disc disease nor other acute abnormality of the thoracic spine.

## 2015-11-22 IMAGING — CR DG CHEST 2V
2 series · 2 of 2 positions shown · non-contrast
Comparison: Two-view chest x-ray 04/12/2015.  CT chest 08/06/2009

CLINICAL DATA: Chest pain.

EXAM:
CHEST - 2 VIEW

[view not recorded (1 of 2)]
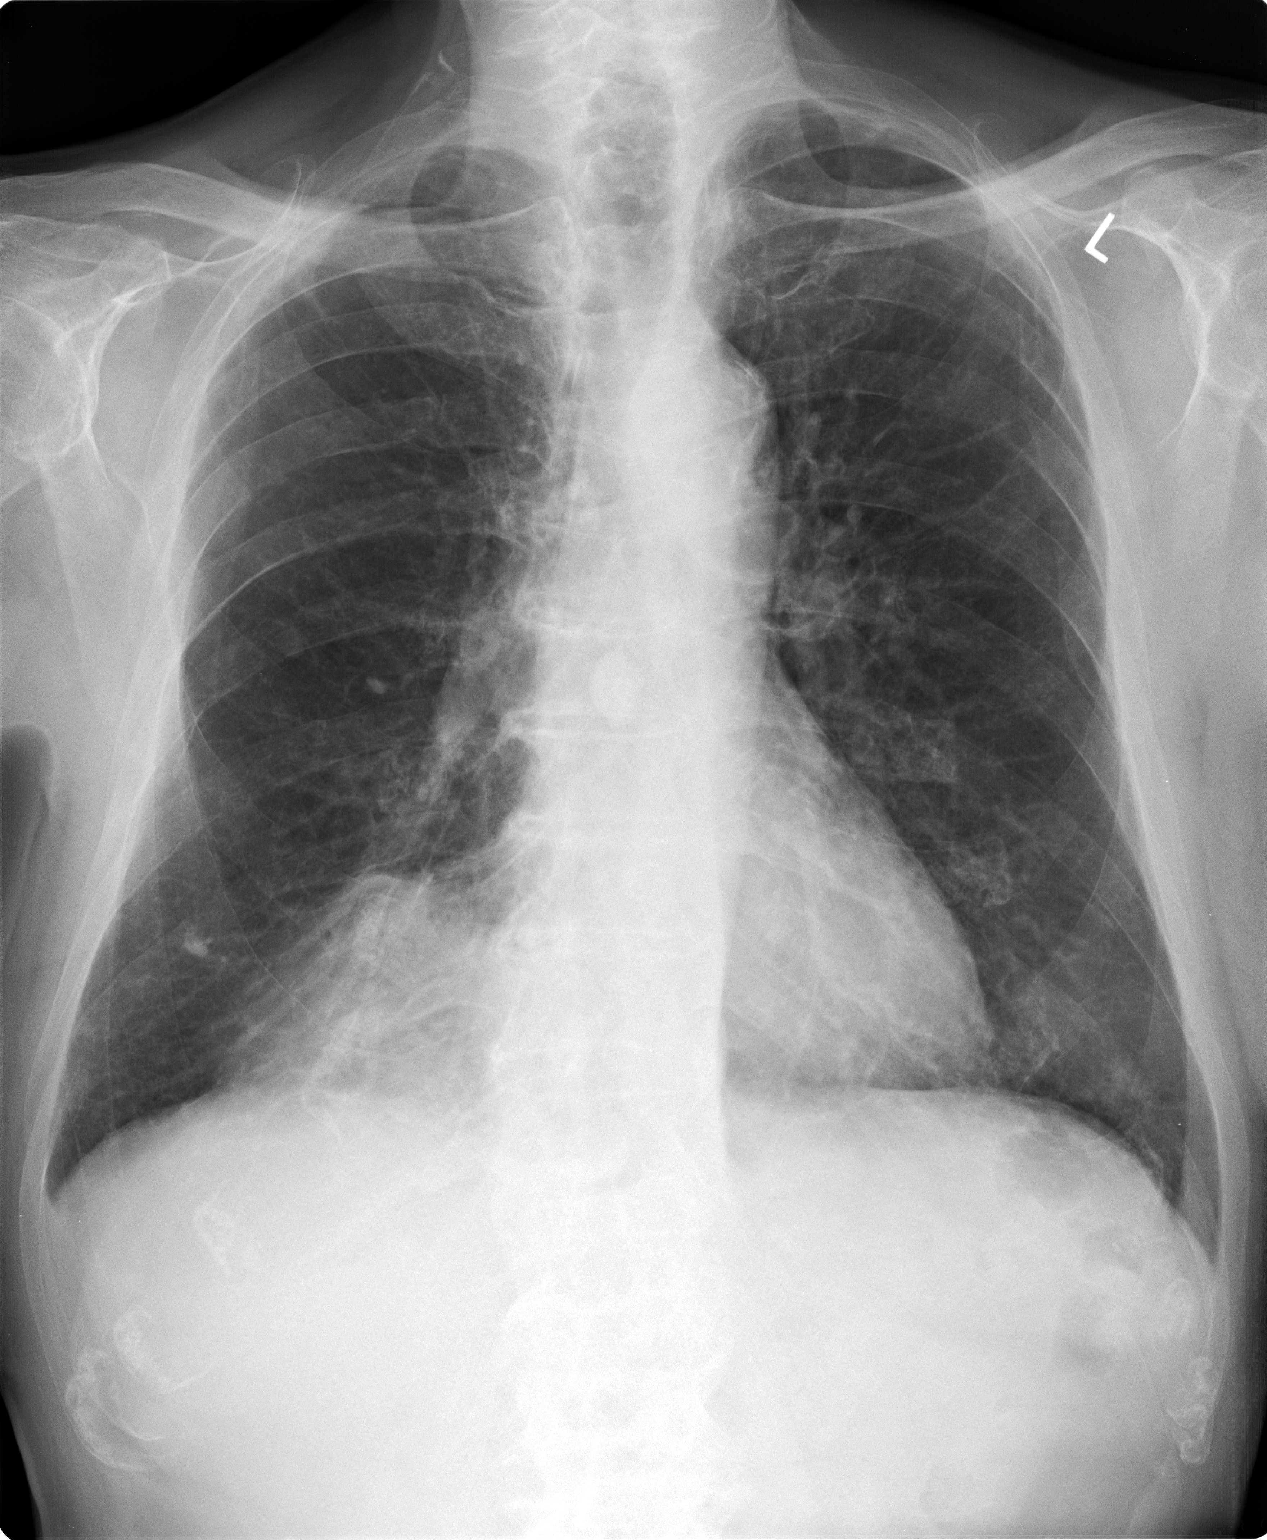

[view not recorded (2 of 2)]
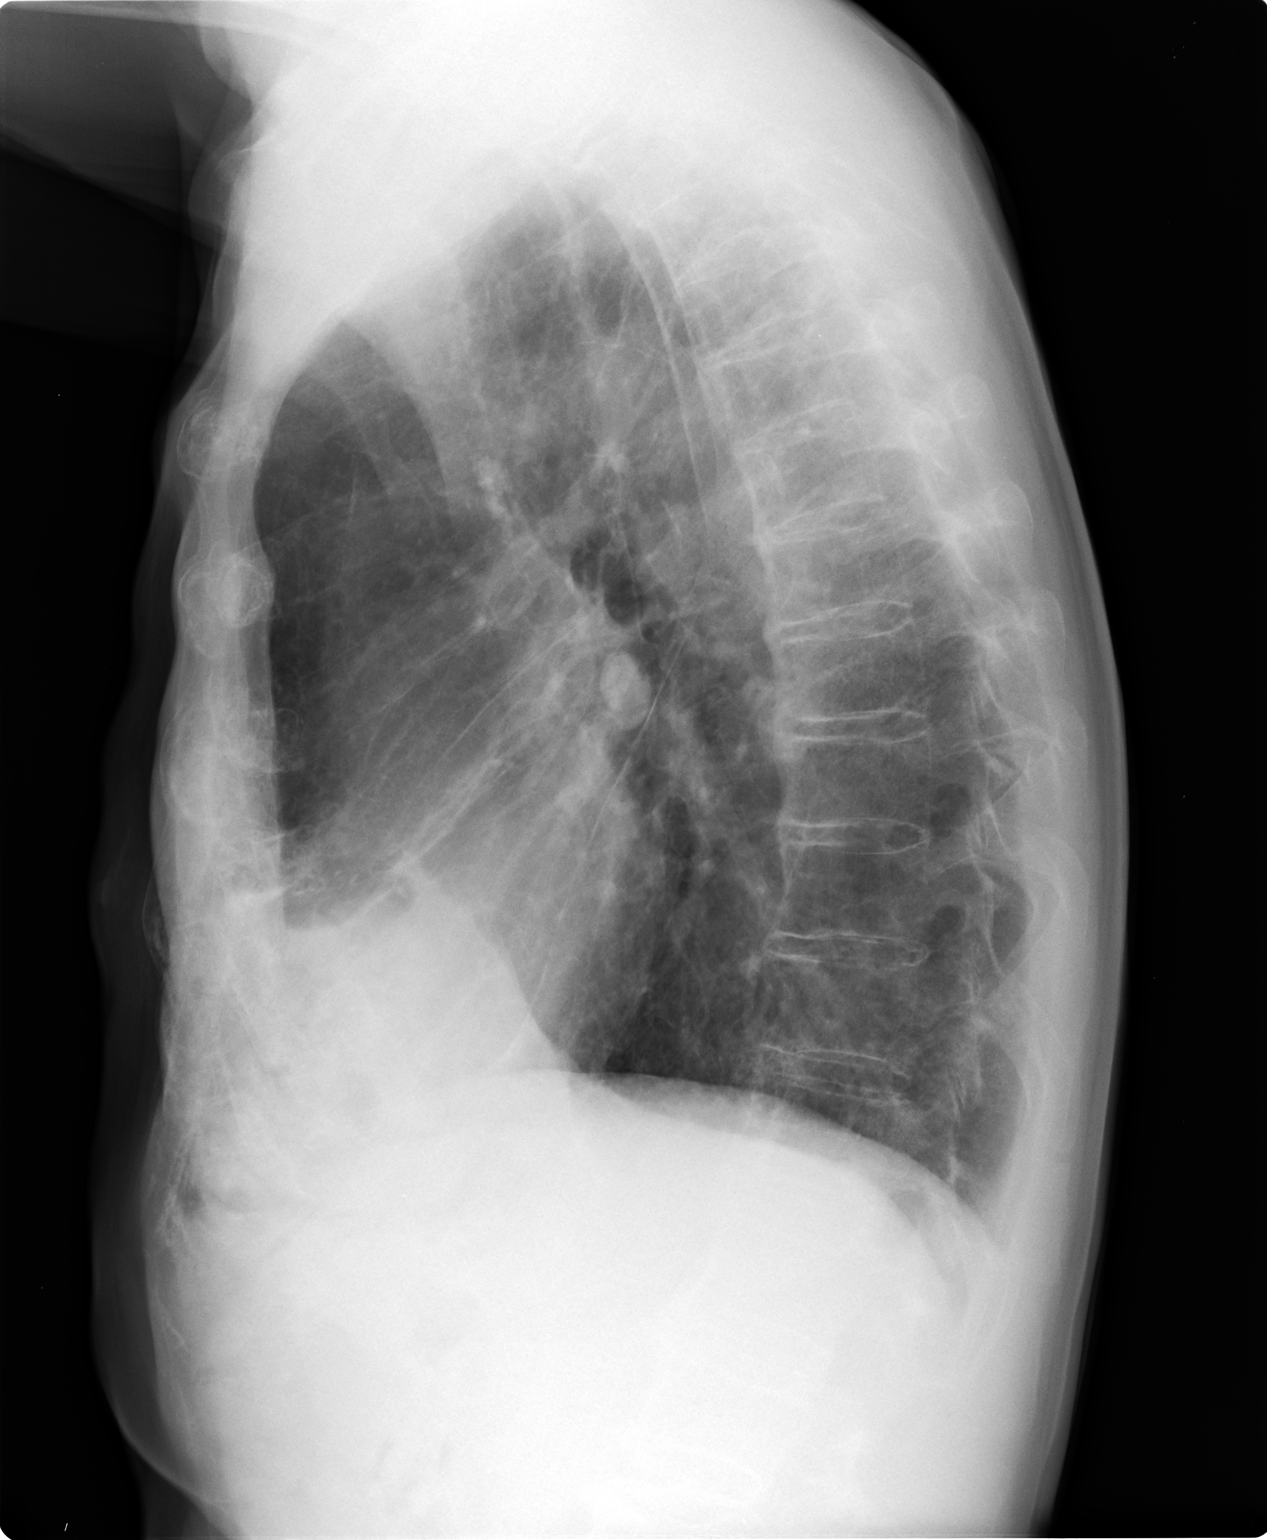

[2 of 2 positions shown; findings below may reference images not displayed]

FINDINGS: The heart size is normal. Atherosclerotic calcifications are present
at the arch. Calcified lymph nodes are present. Emphysematous
changes are again seen. A large anterior diaphragmatic hernia is
present on the right. The lungs are otherwise clear apart from 2
right-sided granulomata. There is fusion of anterior osteophytes
across multiple levels of the thoracic spine.
IMPRESSION: 1. No acute cardiopulmonary disease or significant interval change.
2. Chronic anterior right diaphragmatic hernia.
3. Emphysema.
4. Calcified lymph nodes and right-sided granulomata compatible with
prior granulomatous disease.
5. DISH of the thoracic spine.
